# Patient Record
Sex: Female | Born: 1970 | Race: White | Hispanic: No | Marital: Married | Smoking: Never smoker
Health system: Southern US, Community
[De-identification: ages and names within clinical notes are randomized; demographics above are authoritative.]

## PROBLEM LIST (undated history)

## (undated) DIAGNOSIS — M069 Rheumatoid arthritis, unspecified: Secondary | ICD-10-CM

## (undated) DIAGNOSIS — G4700 Insomnia, unspecified: Secondary | ICD-10-CM

## (undated) DIAGNOSIS — E039 Hypothyroidism, unspecified: Secondary | ICD-10-CM

## (undated) DIAGNOSIS — F32A Depression, unspecified: Secondary | ICD-10-CM

## (undated) DIAGNOSIS — K219 Gastro-esophageal reflux disease without esophagitis: Secondary | ICD-10-CM

## (undated) DIAGNOSIS — F329 Major depressive disorder, single episode, unspecified: Secondary | ICD-10-CM

## (undated) DIAGNOSIS — F419 Anxiety disorder, unspecified: Secondary | ICD-10-CM

## (undated) DIAGNOSIS — M1990 Unspecified osteoarthritis, unspecified site: Secondary | ICD-10-CM

## (undated) DIAGNOSIS — I10 Essential (primary) hypertension: Secondary | ICD-10-CM

## (undated) HISTORY — PX: TUBAL LIGATION: SHX77

## (undated) HISTORY — PX: CHOLECYSTECTOMY: SHX55

---

## 2004-07-02 ENCOUNTER — Emergency Department: Payer: Self-pay | Admitting: Emergency Medicine

## 2004-12-09 ENCOUNTER — Emergency Department: Payer: Self-pay | Admitting: Emergency Medicine

## 2004-12-13 ENCOUNTER — Emergency Department: Payer: Self-pay | Admitting: Emergency Medicine

## 2005-04-26 ENCOUNTER — Emergency Department: Payer: Self-pay | Admitting: Emergency Medicine

## 2005-06-16 ENCOUNTER — Emergency Department: Payer: Self-pay | Admitting: General Practice

## 2005-06-17 ENCOUNTER — Ambulatory Visit: Payer: Self-pay | Admitting: General Practice

## 2006-08-15 ENCOUNTER — Emergency Department: Payer: Self-pay | Admitting: Emergency Medicine

## 2007-01-30 ENCOUNTER — Emergency Department: Payer: Self-pay | Admitting: Emergency Medicine

## 2007-04-09 ENCOUNTER — Other Ambulatory Visit: Payer: Self-pay

## 2007-04-09 ENCOUNTER — Emergency Department: Payer: Self-pay | Admitting: Emergency Medicine

## 2007-12-24 ENCOUNTER — Emergency Department: Payer: Self-pay | Admitting: Emergency Medicine

## 2008-02-18 ENCOUNTER — Emergency Department: Payer: Self-pay | Admitting: Emergency Medicine

## 2008-04-13 ENCOUNTER — Emergency Department: Payer: Self-pay | Admitting: Internal Medicine

## 2008-04-23 ENCOUNTER — Ambulatory Visit: Payer: Self-pay | Admitting: Family Medicine

## 2008-05-11 ENCOUNTER — Emergency Department: Payer: Self-pay | Admitting: Emergency Medicine

## 2008-05-22 ENCOUNTER — Ambulatory Visit: Payer: Self-pay | Admitting: Family Medicine

## 2008-06-05 ENCOUNTER — Ambulatory Visit: Payer: Self-pay | Admitting: Urology

## 2009-06-22 ENCOUNTER — Ambulatory Visit: Payer: Self-pay | Admitting: Otolaryngology

## 2009-06-25 ENCOUNTER — Emergency Department: Payer: Self-pay | Admitting: Emergency Medicine

## 2010-02-24 ENCOUNTER — Emergency Department: Payer: Self-pay | Admitting: Emergency Medicine

## 2010-04-27 ENCOUNTER — Emergency Department: Payer: Self-pay | Admitting: Internal Medicine

## 2010-11-07 ENCOUNTER — Emergency Department: Payer: Self-pay | Admitting: Emergency Medicine

## 2011-09-02 ENCOUNTER — Emergency Department: Payer: Self-pay | Admitting: *Deleted

## 2011-09-02 LAB — COMPREHENSIVE METABOLIC PANEL
Alkaline Phosphatase: 64 U/L (ref 50–136)
Anion Gap: 7 (ref 7–16)
BUN: 13 mg/dL (ref 7–18)
Bilirubin,Total: 0.4 mg/dL (ref 0.2–1.0)
Calcium, Total: 8 mg/dL — ABNORMAL LOW (ref 8.5–10.1)
Chloride: 105 mmol/L (ref 98–107)
Co2: 27 mmol/L (ref 21–32)
Creatinine: 0.67 mg/dL (ref 0.60–1.30)
Glucose: 65 mg/dL (ref 65–99)
Osmolality: 276 (ref 275–301)
SGPT (ALT): 15 U/L
Sodium: 139 mmol/L (ref 136–145)
Total Protein: 7.6 g/dL (ref 6.4–8.2)

## 2011-09-02 LAB — URINALYSIS, COMPLETE
Leukocyte Esterase: NEGATIVE
Ph: 5 (ref 4.5–8.0)
Specific Gravity: 1.019 (ref 1.003–1.030)
Squamous Epithelial: 5
WBC UR: 1 /HPF (ref 0–5)

## 2011-09-02 LAB — CBC
HCT: 31 % — ABNORMAL LOW (ref 35.0–47.0)
MCH: 24.1 pg — ABNORMAL LOW (ref 26.0–34.0)
MCV: 77 fL — ABNORMAL LOW (ref 80–100)
RDW: 18.1 % — ABNORMAL HIGH (ref 11.5–14.5)

## 2011-09-02 LAB — PREGNANCY, URINE: Pregnancy Test, Urine: NEGATIVE m[IU]/mL

## 2011-09-02 LAB — WET PREP, GENITAL

## 2011-12-19 ENCOUNTER — Emergency Department: Payer: Self-pay | Admitting: *Deleted

## 2011-12-19 LAB — CBC
HCT: 31.3 % — ABNORMAL LOW (ref 35.0–47.0)
HGB: 10.1 g/dL — ABNORMAL LOW (ref 12.0–16.0)
MCH: 23.7 pg — ABNORMAL LOW (ref 26.0–34.0)
MCHC: 32.3 g/dL (ref 32.0–36.0)
MCV: 73 fL — ABNORMAL LOW (ref 80–100)
Platelet: 274 10*3/uL (ref 150–440)
RBC: 4.28 10*6/uL (ref 3.80–5.20)
RDW: 19.1 % — ABNORMAL HIGH (ref 11.5–14.5)
WBC: 6.2 10*3/uL (ref 3.6–11.0)

## 2011-12-19 LAB — BASIC METABOLIC PANEL
Anion Gap: 6 — ABNORMAL LOW (ref 7–16)
BUN: 12 mg/dL (ref 7–18)
Calcium, Total: 8.6 mg/dL (ref 8.5–10.1)
Chloride: 104 mmol/L (ref 98–107)
Co2: 28 mmol/L (ref 21–32)
Creatinine: 0.72 mg/dL (ref 0.60–1.30)
EGFR (African American): >60
Glucose: 115 mg/dL — ABNORMAL HIGH (ref 65–99)
Osmolality: 276 (ref 275–301)

## 2011-12-19 LAB — URINALYSIS, COMPLETE
Bacteria: NONE SEEN
Bilirubin,UR: NEGATIVE
Glucose,UR: NEGATIVE mg/dL (ref 0–75)
Ketone: NEGATIVE
Ph: 5 (ref 4.5–8.0)
Protein: 30
Specific Gravity: 1.019 (ref 1.003–1.030)
Squamous Epithelial: 3

## 2012-07-31 ENCOUNTER — Emergency Department: Payer: Self-pay | Admitting: Emergency Medicine

## 2012-07-31 LAB — URINALYSIS, COMPLETE
Bacteria: NONE SEEN
Bilirubin,UR: NEGATIVE
Leukocyte Esterase: NEGATIVE
Protein: NEGATIVE
RBC,UR: 2 /HPF (ref 0–5)
Specific Gravity: 1.017 (ref 1.003–1.030)
Squamous Epithelial: 22
WBC UR: 1 /HPF (ref 0–5)

## 2012-07-31 LAB — CBC WITH DIFFERENTIAL/PLATELET
Basophil %: 1 %
HCT: 30.7 % — ABNORMAL LOW (ref 35.0–47.0)
HGB: 9.6 g/dL — ABNORMAL LOW (ref 12.0–16.0)
Lymphocyte #: 1.3 10*3/uL (ref 1.0–3.6)
MCH: 21.9 pg — ABNORMAL LOW (ref 26.0–34.0)
RBC: 4.36 10*6/uL (ref 3.80–5.20)

## 2012-07-31 LAB — COMPREHENSIVE METABOLIC PANEL
Albumin: 3.3 g/dL — ABNORMAL LOW (ref 3.4–5.0)
Alkaline Phosphatase: 79 U/L (ref 50–136)
Anion Gap: 6 — ABNORMAL LOW (ref 7–16)
BUN: 13 mg/dL (ref 7–18)
Chloride: 104 mmol/L (ref 98–107)
Co2: 27 mmol/L (ref 21–32)
EGFR (African American): >60
EGFR (Non-African Amer.): >60
Glucose: 84 mg/dL (ref 65–99)
Osmolality: 273 (ref 275–301)
Potassium: 4.1 mmol/L (ref 3.5–5.1)
Sodium: 137 mmol/L (ref 136–145)
Total Protein: 7.6 g/dL (ref 6.4–8.2)

## 2012-07-31 LAB — WET PREP, GENITAL

## 2012-07-31 LAB — LIPASE, BLOOD: Lipase: 109 U/L (ref 73–393)

## 2012-08-02 ENCOUNTER — Emergency Department: Payer: Self-pay | Admitting: Emergency Medicine

## 2012-08-02 LAB — URINALYSIS, COMPLETE
Bacteria: NONE SEEN
Bilirubin,UR: NEGATIVE
Blood: NEGATIVE
Ketone: NEGATIVE
Nitrite: NEGATIVE
Ph: 6 (ref 4.5–8.0)
Specific Gravity: 1.015 (ref 1.003–1.030)
Squamous Epithelial: 4
WBC UR: 1 /HPF (ref 0–5)

## 2012-08-02 LAB — CBC
HCT: 33 % — ABNORMAL LOW (ref 35.0–47.0)
HGB: 10.2 g/dL — ABNORMAL LOW (ref 12.0–16.0)
MCH: 21.9 pg — ABNORMAL LOW (ref 26.0–34.0)
MCV: 71 fL — ABNORMAL LOW (ref 80–100)
RBC: 4.67 10*6/uL (ref 3.80–5.20)
WBC: 5.2 10*3/uL (ref 3.6–11.0)

## 2012-08-02 LAB — COMPREHENSIVE METABOLIC PANEL
Albumin: 3.7 g/dL (ref 3.4–5.0)
Alkaline Phosphatase: 88 U/L (ref 50–136)
Anion Gap: 8 (ref 7–16)
BUN: 15 mg/dL (ref 7–18)
Bilirubin,Total: 0.4 mg/dL (ref 0.2–1.0)
Calcium, Total: 8.7 mg/dL (ref 8.5–10.1)
Chloride: 100 mmol/L (ref 98–107)
Creatinine: 0.79 mg/dL (ref 0.60–1.30)
EGFR (African American): >60
Potassium: 3.7 mmol/L (ref 3.5–5.1)
SGOT(AST): 46 U/L — ABNORMAL HIGH (ref 15–37)
SGPT (ALT): 39 U/L (ref 12–78)
Sodium: 134 mmol/L — ABNORMAL LOW (ref 136–145)
Total Protein: 8.3 g/dL — ABNORMAL HIGH (ref 6.4–8.2)

## 2012-08-02 LAB — LIPASE, BLOOD: Lipase: 182 U/L (ref 73–393)

## 2012-09-08 ENCOUNTER — Emergency Department: Payer: Self-pay | Admitting: Emergency Medicine

## 2013-06-23 ENCOUNTER — Emergency Department: Payer: Self-pay | Admitting: Emergency Medicine

## 2013-06-24 LAB — CBC
HCT: 35 % (ref 35.0–47.0)
HGB: 11.3 g/dL — ABNORMAL LOW (ref 12.0–16.0)
MCH: 27.2 pg (ref 26.0–34.0)
MCHC: 32.4 g/dL (ref 32.0–36.0)
MCV: 84 fL (ref 80–100)
PLATELETS: 228 10*3/uL (ref 150–440)
RBC: 4.17 10*6/uL (ref 3.80–5.20)
RDW: 17.7 % — AB (ref 11.5–14.5)
WBC: 4.8 10*3/uL (ref 3.6–11.0)

## 2013-06-24 LAB — BASIC METABOLIC PANEL
Anion Gap: 9 (ref 7–16)
BUN: 8 mg/dL (ref 7–18)
CALCIUM: 8.1 mg/dL — AB (ref 8.5–10.1)
CHLORIDE: 100 mmol/L (ref 98–107)
Co2: 24 mmol/L (ref 21–32)
Creatinine: 0.68 mg/dL (ref 0.60–1.30)
EGFR (Non-African Amer.): >60
GLUCOSE: 82 mg/dL (ref 65–99)
Osmolality: 264 (ref 275–301)
POTASSIUM: 3.7 mmol/L (ref 3.5–5.1)
SODIUM: 133 mmol/L — AB (ref 136–145)

## 2013-06-24 LAB — TROPONIN I: Troponin-I: <0.02 ng/mL

## 2013-08-25 ENCOUNTER — Inpatient Hospital Stay: Payer: Self-pay | Admitting: Surgery

## 2013-08-25 LAB — CBC WITH DIFFERENTIAL/PLATELET
Basophil #: 0 10*3/uL (ref 0.0–0.1)
Basophil %: 0.2 %
Eosinophil #: 0 10*3/uL (ref 0.0–0.7)
Eosinophil %: 0.5 %
HCT: 35 % (ref 35.0–47.0)
HGB: 11.3 g/dL — ABNORMAL LOW (ref 12.0–16.0)
LYMPHS ABS: 1 10*3/uL (ref 1.0–3.6)
LYMPHS PCT: 13.3 %
MCH: 26.4 pg (ref 26.0–34.0)
MCHC: 32.2 g/dL (ref 32.0–36.0)
MCV: 82 fL (ref 80–100)
Monocyte #: 0.6 x10 3/mm (ref 0.2–0.9)
Monocyte %: 7.6 %
NEUTROS ABS: 6.2 10*3/uL (ref 1.4–6.5)
Neutrophil %: 78.4 %
PLATELETS: 212 10*3/uL (ref 150–440)
RBC: 4.26 10*6/uL (ref 3.80–5.20)
RDW: 17.1 % — AB (ref 11.5–14.5)
WBC: 7.9 10*3/uL (ref 3.6–11.0)

## 2013-08-25 LAB — COMPREHENSIVE METABOLIC PANEL
ALBUMIN: 3.8 g/dL (ref 3.4–5.0)
ALK PHOS: 56 U/L
ANION GAP: 8 (ref 7–16)
AST: 98 U/L — AB (ref 15–37)
BILIRUBIN TOTAL: 1.1 mg/dL — AB (ref 0.2–1.0)
BUN: 11 mg/dL (ref 7–18)
CO2: 22 mmol/L (ref 21–32)
Calcium, Total: 8.3 mg/dL — ABNORMAL LOW (ref 8.5–10.1)
Chloride: 101 mmol/L (ref 98–107)
Creatinine: 0.8 mg/dL (ref 0.60–1.30)
EGFR (African American): >60
EGFR (Non-African Amer.): >60
Glucose: 128 mg/dL — ABNORMAL HIGH (ref 65–99)
Osmolality: 264 (ref 275–301)
POTASSIUM: 4.1 mmol/L (ref 3.5–5.1)
SGPT (ALT): 100 U/L — ABNORMAL HIGH (ref 12–78)
Sodium: 131 mmol/L — ABNORMAL LOW (ref 136–145)
Total Protein: 8.8 g/dL — ABNORMAL HIGH (ref 6.4–8.2)

## 2013-08-25 LAB — URINALYSIS, COMPLETE
Bilirubin,UR: NEGATIVE
Glucose,UR: NEGATIVE mg/dL (ref 0–75)
LEUKOCYTE ESTERASE: NEGATIVE
NITRITE: NEGATIVE
Ph: 5 (ref 4.5–8.0)
Protein: 100
RBC,UR: 86 /HPF (ref 0–5)
Specific Gravity: 1.025 (ref 1.003–1.030)
WBC UR: 5 /HPF (ref 0–5)

## 2013-08-25 LAB — PREGNANCY, URINE: Pregnancy Test, Urine: NEGATIVE m[IU]/mL

## 2013-08-25 LAB — LIPASE, BLOOD: LIPASE: 212 U/L (ref 73–393)

## 2013-08-25 LAB — TROPONIN I

## 2013-08-26 LAB — COMPREHENSIVE METABOLIC PANEL
Albumin: 3 g/dL — ABNORMAL LOW (ref 3.4–5.0)
Alkaline Phosphatase: 60 U/L
Anion Gap: 2 — ABNORMAL LOW (ref 7–16)
BILIRUBIN TOTAL: 0.6 mg/dL (ref 0.2–1.0)
BUN: 13 mg/dL (ref 7–18)
Calcium, Total: 8.1 mg/dL — ABNORMAL LOW (ref 8.5–10.1)
Chloride: 107 mmol/L (ref 98–107)
Co2: 29 mmol/L (ref 21–32)
Creatinine: 0.9 mg/dL (ref 0.60–1.30)
EGFR (African American): >60
EGFR (Non-African Amer.): >60
GLUCOSE: 107 mg/dL — AB (ref 65–99)
Osmolality: 276 (ref 275–301)
POTASSIUM: 4.3 mmol/L (ref 3.5–5.1)
SGOT(AST): 56 U/L — ABNORMAL HIGH (ref 15–37)
SGPT (ALT): 75 U/L (ref 12–78)
SODIUM: 138 mmol/L (ref 136–145)
Total Protein: 7.1 g/dL (ref 6.4–8.2)

## 2013-08-26 LAB — CBC WITH DIFFERENTIAL/PLATELET
BASOS ABS: 0 10*3/uL (ref 0.0–0.1)
Basophil %: 0.3 %
EOS ABS: 0.1 10*3/uL (ref 0.0–0.7)
Eosinophil %: 2.4 %
HCT: 30.7 % — ABNORMAL LOW (ref 35.0–47.0)
HGB: 9.8 g/dL — ABNORMAL LOW (ref 12.0–16.0)
LYMPHS PCT: 27.2 %
Lymphocyte #: 1.3 10*3/uL (ref 1.0–3.6)
MCH: 26.3 pg (ref 26.0–34.0)
MCHC: 32.1 g/dL (ref 32.0–36.0)
MCV: 82 fL (ref 80–100)
MONO ABS: 0.7 x10 3/mm (ref 0.2–0.9)
MONOS PCT: 14.6 %
NEUTROS PCT: 55.5 %
Neutrophil #: 2.7 10*3/uL (ref 1.4–6.5)
Platelet: 169 10*3/uL (ref 150–440)
RBC: 3.74 10*6/uL — AB (ref 3.80–5.20)
RDW: 16.9 % — ABNORMAL HIGH (ref 11.5–14.5)
WBC: 4.8 10*3/uL (ref 3.6–11.0)

## 2013-08-27 LAB — PATHOLOGY REPORT

## 2013-10-09 ENCOUNTER — Telehealth: Payer: Self-pay

## 2013-10-15 NOTE — Telephone Encounter (Signed)
Opened in error, wrong pt

## 2014-08-02 NOTE — H&P (Signed)
Subjective/Chief Complaint RUQ pain   History of Present Illness 12 hours pain, nausea, no emesis, no f/c, no prior episode no jaundice no ach stools worsening   Past History RA, HTN, GERD PSH tubal lig, thyroidectomy   Past Medical Health Hypertension, Alcohol Consumption/Dependency   Past Med/Surgical Hx:  depression:   insomnia:   MRSA-2009:   PSORISIS:   GERD - Esophageal Reflux:   Hypothyroidism:   Arthritis:   Tubal Ligation:   ALLERGIES:  Sulfa drugs: Other  HOME MEDICATIONS: Medication Instructions Status  Ultram 50 mg oral tablet 2 tab(s) orally every 8 hours, As Needed - for Pain Active  omeprazole 20 mg oral delayed release capsule 1 cap(s) PO 2 times a day  Active  levothyroxine 112 mcg (0.112 mg) oral tablet 1 tab(s)  once a day AM Active  Enbrel  subcutaneous  Active  Cipro 750 mg oral tablet 1 tab(s) orally every 12 hours Active  doxycycline hyclate hyclate 100 mg oral tablet 1 tab(s) orally 2 times a day Active   Family and Social History:  Family History Non-Contributory   Social History negative tobacco, positive ETOH, disabled   Place of Living Home   Review of Systems:  Fever/Chills No   Cough No   Abdominal Pain Yes   Diarrhea No   Constipation No   Nausea/Vomiting Yes   SOB/DOE No   Chest Pain No   Dysuria No   Tolerating Diet Yes  Nauseated   Physical Exam:  GEN no acute distress, thin   HEENT pink conjunctivae, no icterus, mult piercings   NECK supple   RESP normal resp effort  clear BS   CARD regular rate   ABD positive tenderness  RUQ, pos Murphy's sign   LYMPH negative axillae   EXTR negative edema   SKIN normal to palpation, piercings, tattoos   PSYCH alert, A+O to time, place, person, good insight   Lab Results: Hepatic:  17-May-15 16:36   Bilirubin, Total  1.1  Alkaline Phosphatase 56 (45-117 NOTE: New Reference Range 03/01/13)  SGPT (ALT)  100  SGOT (AST)  98  Total Protein, Serum  8.8   Albumin, Serum 3.8  Routine Chem:  17-May-15 16:36   Glucose, Serum  128  BUN 11  Creatinine (comp) 0.80  Sodium, Serum  131  Potassium, Serum 4.1  Chloride, Serum 101  CO2, Serum 22  Calcium (Total), Serum  8.3  Osmolality (calc) 264  eGFR (African American) >60  eGFR (Non-African American) >60 (eGFR values <17m/min/1.73 m2 may be an indication of chronic kidney disease (CKD). Calculated eGFR is useful in patients with stable renal function. The eGFR calculation will not be reliable in acutely ill patients when serum creatinine is changing rapidly. It is not useful in  patients on dialysis. The eGFR calculation may not be applicable to patients at the low and high extremes of body sizes, pregnant women, and vegetarians.)  Anion Gap 8  Lipase 212 (Result(s) reported on 25 Aug 2013 at 05:10PM.)  Cardiac:  17-May-15 16:36   Troponin I < 0.02 (0.00-0.05 0.05 ng/mL or less: NEGATIVE  Repeat testing in 3-6 hrs  if clinically indicated. >0.05 ng/mL: POTENTIAL  MYOCARDIAL INJURY. Repeat  testing in 3-6 hrs if  clinically indicated. NOTE: An increase or decrease  of 30% or more on serial  testing suggests a  clinically important change)  Routine UA:  17-May-15 16:36   Color (UA) Amber  Clarity (UA) Hazy  Glucose (UA) Negative  Bilirubin (UA)  Negative  Ketones (UA) Trace  Specific Gravity (UA) 1.025  Blood (UA) 3+  pH (UA) 5.0  Protein (UA) 100 mg/dL  Nitrite (UA) Negative  Leukocyte Esterase (UA) Negative (Result(s) reported on 25 Aug 2013 at 05:10PM.)  RBC (UA) 86 /HPF  WBC (UA) 5 /HPF  Bacteria (UA) TRACE  Epithelial Cells (UA) 7 /HPF  Mucous (UA) PRESENT (Result(s) reported on 25 Aug 2013 at 05:10PM.)  Routine Sero:  17-May-15 16:36   Pregnancy Test, Urine NEGATIVE (The results of the qualitative urine HCG (Pregnancy Test) should be evaluated in light of other clinical information.  There are limitations to the test which, in certain clinical situations,  may result in a false positive or negative result. Thehigh dose hook effect can occur in urine samples with extremely high HCG concentrations.  This effect can produce a negative result in certain situations. It is suggested that results of the qualitative HCG be confirmed by an alternate methodology, such as the quantitative serum beta HCG test.)  Routine Hem:  17-May-15 16:36   WBC (CBC) 7.9  RBC (CBC) 4.26  Hemoglobin (CBC)  11.3  Hematocrit (CBC) 35.0  Platelet Count (CBC) 212  MCV 82  MCH 26.4  MCHC 32.2  RDW  17.1  Neutrophil % 78.4  Lymphocyte % 13.3  Monocyte % 7.6  Eosinophil % 0.5  Basophil % 0.2  Neutrophil # 6.2  Lymphocyte # 1.0  Monocyte # 0.6  Eosinophil # 0.0  Basophil # 0.0 (Result(s) reported on 25 Aug 2013 at 05:05PM.)   Radiology Results: [place]:    17-May-15 18:43, US Abdomen Limited Survey  US Abdomen Limited Survey  REASON FOR EXAM:    RUQ pain  COMMENTS:   Body Site: Gallbladder, Liver, Common Bile Duct    PROCEDURE: [place]  - US ABDOMEN LIMITED SURVEY  - Aug 25 2013  6:43PM     CLINICAL DATA:  Right upper quadrant pain    EXAM:  US ABDOMEN LIMITED - RIGHT UPPER QUADRANT    COMPARISON:  CT ABD-PELV W/ CM dated 08/02/2012    FINDINGS:  Gallbladder:  Circumferential gallbladder wall thickening measuring up to 7 mm.  Small amount of pericholecystic fluid. Sonographer reports positive  sonographic Murphy's sign. No cholelithiasis.    Common bile duct:    Diameter: 7 mm    Liver:    No focal lesion identified.  Mildly increased in echogenicity.     IMPRESSION:  Gallbladder wall thickening with a small amount of pericholecystic  fluid. Findings may represent acuteacalculous cholecystitis in the  appropriate clinical setting.    The common bile duct is dilated measuring up to 7 mm. Causative  etiology is not identified on this examination.    Findings can be correlated with MRCP as clinically indicated.    Increased hepatic parenchymal  echogenicity as can be seen with  hepatic steatosis.      Electronically Signed    By: Lovey Newcomer M.D.    On: 08/25/2013 18:46     Verified By: Ilsa Iha, M.D.,    Assessment/Admission Diagnosis acute cholecystitis, suspect sludge with inc LFT rec admit, IV abx, repeat LFT then proceed with lap chole pt understands Dr Leanora Cover will see in am   Electronic Signatures: Florene Glen (MD)  (Signed 17-May-15 19:50)  Authored: CHIEF COMPLAINT and HISTORY, PAST MEDICAL/SURGIAL HISTORY, ALLERGIES, HOME MEDICATIONS, FAMILY AND SOCIAL HISTORY, REVIEW OF SYSTEMS, PHYSICAL EXAM, LABS, Radiology, ASSESSMENT AND PLAN   Last Updated: 17-May-15 19:50 by Phoebe Perch  E (MD)

## 2014-08-02 NOTE — Op Note (Signed)
PATIENT NAME:  Marcia Pugh, Marcia Pugh MR#:  [id number] DATE OF BIRTH:  1970-08-14  DATE OF PROCEDURE:  08/26/2013  OPERATION PERFORMED: Laparoscopic cholecystectomy with intraoperative cholangiogram.   PREOPERATIVE DIAGNOSIS: Symptomatic acalculous cholecystitis.   POSTOPERATIVE DIAGNOSIS: Symptomatic acalculous cholecystitis.   SURGEON: Claude Manges, MD   ANESTHESIA: General.   PROCEDURE IN DETAIL: The patient was placed supine on the operating room table and prepped and draped in the usual sterile fashion. A 15 mmHg CO2 pneumoperitoneum was created via a Veress needle in the infraumbilical position, and this was replaced with a 5 mm trocar and a 30 degree angled laparoscope. Remaining trocars were placed under direct visualization. The fundus of the gallbladder was retracted superiorly and ventrally, and the very edematous gallbladder wall had chronic adhesions from the omentum to its visceral surface and these were taken down bluntly with the aid of the electrocautery. The infundibulum was retracted laterally, opening up the triangle of Calot, and dissection within the triangle of Calot revealed a cystic artery, which was doubly clipped and divided, and a cystic duct and a clip was placed at the cystic duct-gallbladder junction and a small ductotomy was performed, and a Reddick catheter was used with fluoroscopy and half-strength Hypaque for an intraoperative cholangiogram. Initially the balloon was occluding the distal common bile duct, and only the intrahepatic radicles and common hepatic duct were visualized. I was able to draw the balloon back and visualize the common bile duct and the sphincter of Oddi and contrast flowing directly into the duodenum. There were no filling defects. Therefore, the Reddick catheter was removed and the cystic duct stump was closed with 2 firings of the Hem-o-Lok clip device parallel to one another. An additional clip was placed on a posterior cystic artery, and the  gallbladder was removed from the liver bed with the electrocautery, placed in an Endo Catch bag, and extracted from the abdomen via the epigastric port. This port site fascia was closed with the laparoscopic puncture closure device and a 0 Vicryl suture, and the right upper quadrant was irrigated with copious amounts of warm normal saline. This was suctioned clear. Hemostasis was excellent. The clips were secure and there was no evidence of bile staining. The gallbladder was opened extracorporeally and indeed contained no stones and no sludge, but the wall was indeed edematous and chronically inflamed. All 4 skin sites were closed with subcuticular 5-0 Monocryl and suture strips. The patient tolerated the procedure well. There were no complications.    ____________________________ Claude Manges, MD wfm:jcm D: 08/26/2013 14:29:10 ET T: 08/26/2013 15:18:49 ET JOB#: [id number]  cc: Claude Manges, MD, <Dictator> Claude Manges MD ELECTRONICALLY SIGNED 08/28/2013 8:00

## 2014-08-02 NOTE — Discharge Summary (Signed)
PATIENT NAME:  Marcia Pugh, Marcia Pugh MR#:  [id number] DATE OF BIRTH:  [birth year]  DATE OF ADMISSION:  08/25/2013 DATE OF DISCHARGE:  08/27/2013  PRINCIPLE DIAGNOSIS: Acute acalculous cholecystitis.   OTHER DIAGNOSES: 1.  Rheumatoid arthritis. 2.  Hypertension. 3.  Gastroesophageal reflux disease. 4.  Status post thyroidectomy.   PRINCIPAL PROCEDURE PERFORMED DURING THIS ADMISSION: Laparoscopic cholecystectomy with intraoperative cholangiogram.   HOSPITAL COURSE: The patient was admitted to the hospital, given IV antibiotics, taken to the operating room where she underwent the above-mentioned procedure for the above-mentioned diagnosis, and on postoperative day 1 she was doing well and was discharged home with a prescription for Percocet for pain. She was asked to make an appointment to see me in 2 weeks and to call the office in the interim for jaundice, recurrent right upper quadrant pain, fever, or other surgical difficulties.   ____________________________ Claude Manges, MD wfm:sb D: 08/27/2013 08:06:09 ET T: 08/27/2013 08:32:23 ET JOB#: [id number]  cc: Claude Manges, MD, <Dictator> Claude Manges MD ELECTRONICALLY SIGNED 08/28/2013 8:00

## 2014-08-02 NOTE — H&P (Signed)
PATIENT NAME:  Marcia Pugh, SKELLEY MR#:  [id number] DATE OF BIRTH:  [birth year]  DATE OF ADMISSION:  08/25/2013  CHIEF COMPLAINT: Right upper quadrant pain.   HISTORY OF PRESENT ILLNESS: This is a patient with approximately 12 hours of abdominal pain that started in the epigastrium and right upper quadrant. She did not relate it to any fatty foods and was perfectly fine yesterday. It woke her up this morning. She has had some nausea, but no emesis, has never had an episode like this before. Denies fevers or chills. Denies jaundice or acholic stools and states that her pain is worsening from this morning.   PAST MEDICAL HISTORY: Rheumatoid arthritis, hypertension, reflux disease.   PAST SURGICAL HISTORY: Thyroidectomy, tubal ligation.   FAMILY HISTORY: Noncontributory.   SOCIAL HISTORY: The patient does not smoke but she drinks approximately 8 beers per day. Does not use IV drugs and is disabled.   FAMILY HISTORY: Noncontributory.   REVIEW OF SYSTEMS: A 10-system review is performed and negative with the exception of that mentioned in the history of present illness.   MEDICATIONS: Multiple, see reconciliation chart.   ALLERGIES: SULFA.   PHYSICAL EXAMINATION: GENERAL: Healthy, comfortable-appearing, thin female patient with a BMI of 26.  HEENT: No scleral icterus.  NECK: No palpable neck nodes.  CHEST: Clear to auscultation.  CARDIAC: Regular rate and rhythm.  ABDOMEN: Soft. There is tenderness in the right upper quadrant with a positive Murphy's sign. Tubal ligation scar is noted. Nondistended, nontympanitic.  EXTREMITIES: Without edema.  NEUROLOGIC: Grossly intact.  INTEGUMENT: No jaundice.   LABORATORY, DIAGNOSTIC AND RADIOLOGICAL DATA:  An ultrasound shows probable acalculous cholecystitis although in personal review I suspect that there is sludge present as well. Urine pregnancy test is negative. Lipase of 212, AST and ALT of 98 and 100 with a bilirubin 1.1, sodium of 131 glucose of  128. White blood cell count 7.9, hemoglobin and hematocrit of 11 and 35 and a platelet count 212.   ASSESSMENT AND PLAN: This is a patient with probable sludge causing cholecystitis with elevated liver function tests.  I have recommended admission to the hospital, starting IV antibiotics, hydrating and controlling her nausea and pain; re-evaluating with additional liver function tests in the morning. I suspect she will likely need laparoscopic cholecystectomy for control of her symptoms. Cholangiograms would be indicated as well. I will discuss this with Dr. Anda Kraft, who will be covering tomorrow, and I will make her n.p.o. after midnight. This has been discussed with her and she was in full agreement.   ____________________________ Adah Salvage. Excell Seltzer, MD rec:cs D: 08/25/2013 19:58:57 ET T: 08/25/2013 20:18:13 ET JOB#: [id number]  cc: Adah Salvage. Excell Seltzer, MD, <Dictator> Lattie Haw MD ELECTRONICALLY SIGNED 08/25/2013 21:29

## 2014-08-05 ENCOUNTER — Emergency Department
Admit: 2014-08-05 | Disposition: A | Discharge status: Home / Self Care | Payer: Self-pay | Admitting: Emergency Medicine

## 2014-08-08 ENCOUNTER — Emergency Department
Admit: 2014-08-08 | Disposition: A | Discharge status: Home / Self Care | Payer: Self-pay | Admitting: Emergency Medicine

## 2014-08-10 ENCOUNTER — Inpatient Hospital Stay
Admission: EM | Admit: 2014-08-10 | Discharge: 2014-08-16 | DRG: 603 | Disposition: A | Discharge status: Home / Self Care | Payer: Medicare Other | Attending: Internal Medicine | Admitting: Internal Medicine

## 2014-08-10 ENCOUNTER — Emergency Department: Payer: Medicare Other

## 2014-08-10 ENCOUNTER — Encounter: Payer: Self-pay | Admitting: Emergency Medicine

## 2014-08-10 DIAGNOSIS — Z79899 Other long term (current) drug therapy: Secondary | ICD-10-CM

## 2014-08-10 DIAGNOSIS — M1990 Unspecified osteoarthritis, unspecified site: Secondary | ICD-10-CM | POA: Diagnosis not present

## 2014-08-10 DIAGNOSIS — Z452 Encounter for adjustment and management of vascular access device: Secondary | ICD-10-CM

## 2014-08-10 DIAGNOSIS — M069 Rheumatoid arthritis, unspecified: Secondary | ICD-10-CM | POA: Diagnosis present

## 2014-08-10 DIAGNOSIS — E039 Hypothyroidism, unspecified: Secondary | ICD-10-CM | POA: Diagnosis not present

## 2014-08-10 DIAGNOSIS — F32A Depression, unspecified: Secondary | ICD-10-CM | POA: Diagnosis present

## 2014-08-10 DIAGNOSIS — I10 Essential (primary) hypertension: Secondary | ICD-10-CM | POA: Diagnosis present

## 2014-08-10 DIAGNOSIS — F329 Major depressive disorder, single episode, unspecified: Secondary | ICD-10-CM | POA: Diagnosis not present

## 2014-08-10 DIAGNOSIS — L03115 Cellulitis of right lower limb: Secondary | ICD-10-CM | POA: Diagnosis present

## 2014-08-10 DIAGNOSIS — R52 Pain, unspecified: Secondary | ICD-10-CM

## 2014-08-10 DIAGNOSIS — W540XXA Bitten by dog, initial encounter: Secondary | ICD-10-CM | POA: Diagnosis present

## 2014-08-10 DIAGNOSIS — F419 Anxiety disorder, unspecified: Secondary | ICD-10-CM | POA: Diagnosis not present

## 2014-08-10 DIAGNOSIS — Z79891 Long term (current) use of opiate analgesic: Secondary | ICD-10-CM

## 2014-08-10 DIAGNOSIS — G4700 Insomnia, unspecified: Secondary | ICD-10-CM | POA: Diagnosis not present

## 2014-08-10 DIAGNOSIS — D509 Iron deficiency anemia, unspecified: Secondary | ICD-10-CM | POA: Diagnosis not present

## 2014-08-10 DIAGNOSIS — Z882 Allergy status to sulfonamides status: Secondary | ICD-10-CM | POA: Diagnosis not present

## 2014-08-10 DIAGNOSIS — K219 Gastro-esophageal reflux disease without esophagitis: Secondary | ICD-10-CM | POA: Diagnosis present

## 2014-08-10 DIAGNOSIS — E876 Hypokalemia: Secondary | ICD-10-CM | POA: Diagnosis present

## 2014-08-10 DIAGNOSIS — D649 Anemia, unspecified: Secondary | ICD-10-CM | POA: Diagnosis present

## 2014-08-10 HISTORY — DX: Essential (primary) hypertension: I10

## 2014-08-10 HISTORY — DX: Unspecified osteoarthritis, unspecified site: M19.90

## 2014-08-10 HISTORY — DX: Hypothyroidism, unspecified: E03.9

## 2014-08-10 HISTORY — DX: Depression, unspecified: F32.A

## 2014-08-10 HISTORY — DX: Insomnia, unspecified: G47.00

## 2014-08-10 HISTORY — DX: Major depressive disorder, single episode, unspecified: F32.9

## 2014-08-10 HISTORY — DX: Anxiety disorder, unspecified: F41.9

## 2014-08-10 HISTORY — DX: Rheumatoid arthritis, unspecified: M06.9

## 2014-08-10 HISTORY — DX: Gastro-esophageal reflux disease without esophagitis: K21.9

## 2014-08-10 LAB — COMPREHENSIVE METABOLIC PANEL
ALBUMIN: 3.7 g/dL (ref 3.5–5.0)
ALT: 34 U/L (ref 14–54)
ANION GAP: 7 (ref 5–15)
AST: 40 U/L (ref 15–41)
Alkaline Phosphatase: 54 U/L (ref 38–126)
BUN: 16 mg/dL (ref 6–20)
CALCIUM: 8.3 mg/dL — AB (ref 8.9–10.3)
CO2: 25 mmol/L (ref 22–32)
CREATININE: 0.73 mg/dL (ref 0.44–1.00)
Chloride: 106 mmol/L (ref 101–111)
GFR calc Af Amer: >60 mL/min (ref >60)
GFR calc non Af Amer: >60 mL/min (ref >60)
Glucose, Bld: 94 mg/dL (ref 65–99)
Potassium: 3.4 mmol/L — ABNORMAL LOW (ref 3.5–5.1)
Sodium: 138 mmol/L (ref 135–145)
TOTAL PROTEIN: 7.8 g/dL (ref 6.5–8.1)
Total Bilirubin: 0.6 mg/dL (ref 0.3–1.2)

## 2014-08-10 LAB — CBC WITH DIFFERENTIAL/PLATELET
Basophils Absolute: 0 10*3/uL (ref 0–0.1)
Eosinophils Absolute: 0.2 10*3/uL (ref 0–0.7)
Eosinophils Relative: 4 %
HCT: 28 % — ABNORMAL LOW (ref 35.0–47.0)
Hemoglobin: 8.6 g/dL — ABNORMAL LOW (ref 12.0–16.0)
Lymphs Abs: 1.1 10*3/uL (ref 1.0–3.6)
MCH: 22.6 pg — ABNORMAL LOW (ref 26.0–34.0)
MCHC: 30.9 g/dL — AB (ref 32.0–36.0)
MCV: 73.1 fL — AB (ref 80.0–100.0)
Monocytes Absolute: 0.5 10*3/uL (ref 0.2–0.9)
NEUTROS ABS: 2.7 10*3/uL (ref 1.4–6.5)
Neutrophils Relative %: 59 %
Platelets: 285 10*3/uL (ref 150–440)
RBC: 3.83 MIL/uL (ref 3.80–5.20)
RDW: 17.6 % — AB (ref 11.5–14.5)
WBC: 4.5 10*3/uL (ref 3.6–11.0)

## 2014-08-10 MED ORDER — CLINDAMYCIN PHOSPHATE 600 MG/50ML IV SOLN
600.0000 mg | Freq: Once | INTRAVENOUS | Status: AC
  Administered 2014-08-10: 600 mg via INTRAVENOUS

## 2014-08-10 MED ORDER — KETOROLAC TROMETHAMINE 30 MG/ML IJ SOLN
30.0000 mg | Freq: Once | INTRAMUSCULAR | Status: AC
  Administered 2014-08-10: 30 mg via INTRAVENOUS

## 2014-08-10 MED ORDER — KETOROLAC TROMETHAMINE 30 MG/ML IJ SOLN
INTRAMUSCULAR | Status: AC
  Filled 2014-08-10: qty 1

## 2014-08-10 MED ORDER — MORPHINE SULFATE 4 MG/ML IJ SOLN
4.0000 mg | Freq: Once | INTRAMUSCULAR | Status: AC
  Administered 2014-08-10: 4 mg via INTRAVENOUS

## 2014-08-10 MED ORDER — CLINDAMYCIN PHOSPHATE 600 MG/50ML IV SOLN
INTRAVENOUS | Status: AC
  Filled 2014-08-10: qty 50

## 2014-08-10 MED ORDER — MORPHINE SULFATE 4 MG/ML IJ SOLN
INTRAMUSCULAR | Status: AC
Start: 2014-08-10 — End: 2014-08-10
  Filled 2014-08-10: qty 1

## 2014-08-10 NOTE — ED Provider Notes (Signed)
[facility] Emergency Department Provider Note    ____________________________________________    I have reviewed the triage vital signs and the nursing notes.   HISTORY  Chief Complaint Foot Pain   HPI Marcia Pugh is a 44 y.o. female who is here complaining of worsening right foot pain swelling redness and draining fluid. It started approximately one day ago by a dog last week saw the podiatrist 2 days ago states that the wound appeared to be healing well at that time cellulitis per the patient says her antibiotics is not working her pain is now approximately an 8 out of 10 Hz with any type of movement feels like it's burning and swollencurrently no other associated symptoms    Past Medical History  Diagnosis Date  . Arthritis   . Hypertension   . Anxiety   . Insomnia   . GERD (gastroesophageal reflux disease)   . Depression   . Hypothyroidism   . RA (rheumatoid arthritis)     On enbrel - follows with Dr Beverley Fiedler    Patient Active Problem List   Diagnosis Date Noted  . HTN (hypertension) 08/11/2014  . Anemia 08/11/2014  . GERD (gastroesophageal reflux disease) 08/11/2014  . Depression 08/11/2014  . Arthritis 08/11/2014  . Hypothyroidism 08/11/2014  . Cellulitis of foot, right 08/10/2014    Past Surgical History  Procedure Laterality Date  . Cholecystectomy    . Tubal ligation      Current Outpatient Rx  Name  Route  Sig  Dispense  Refill  . levothyroxine (SYNTHROID, LEVOTHROID) 112 MCG tablet   Oral   Take 112 mcg by mouth daily before breakfast.          . lisinopril (PRINIVIL,ZESTRIL) 10 MG tablet   Oral   Take 10 mg by mouth daily.          Marland Kitchen omeprazole (PRILOSEC) 20 MG capsule   Oral   Take 20 mg by mouth daily.          . traZODone (DESYREL) 100 MG tablet   Oral   Take 100 mg by mouth at bedtime.          Marland Kitchen venlafaxine (EFFEXOR) 50 MG tablet   Oral   Take 50 mg by mouth 2 (two) times daily with a meal.           . Etanercept 25 MG/0.5ML SOSY   Subcutaneous   Inject into the skin.         Marland Kitchen ondansetron (ZOFRAN) 4 MG tablet   Oral   Take 1 tablet (4 mg total) by mouth every 6 (six) hours as needed for nausea.   20 tablet   0   . oxyCODONE-acetaminophen (PERCOCET) 7.5-325 MG per tablet   Oral   Take 1 tablet by mouth every 4 (four) hours as needed for severe pain.   40 tablet   0   . piperacillin-tazobactam (ZOSYN) 3.375 GM/50ML IVPB   Intravenous   Inject 50 mLs (3.375 g total) into the vein every 8 (eight) hours.   50 mL   42   . Vancomycin (VANCOCIN) 750 MG/150ML SOLN   Intravenous   Inject 150 mLs (750 mg total) into the vein every 8 (eight) hours.   150 mL   42     Allergies Sulfa antibiotics  Family History  Problem Relation Age of Onset  . Lymphoma Mother   . Diabetes type II Other     Social History History  Substance  Use Topics  . Smoking status: Never Smoker   . Smokeless tobacco: Not on file  . Alcohol Use: No    Review of Systems  Constitutional: Negative for fever. Eyes: Negative for visual changes. ENT: Negative for sore throat. Cardiovascular: Negative for chest pain. Respiratory: Negative for shortness of breath. Gastrointestinal: Negative for abdominal pain, vomiting and diarrhea. Genitourinary: Negative for dysuria. Musculoskeletal: Negative for back pain. Skin: Negative for rash. Neurological: Negative for headaches, focal weakness or numbness. Psychiatric patient is well no signs of anxiety   10-point ROS otherwise negative. Except for noted in the history of present illness  ____________________________________________   PHYSICAL EXAM:  VITAL SIGNS: ED Triage Vitals  Enc Vitals Group     BP 08/10/14 1912 179/98 mmHg     Pulse Rate 08/10/14 1912 71     Resp 08/10/14 1912 20     Temp 08/10/14 1912 98.2 F (36.8 C)     Temp Source 08/10/14 1912 Oral     SpO2 08/10/14 1912 96 %     Weight 08/10/14 1912 175 lb (79.379  kg)     Height 08/10/14 1912 5\' 8"  (1.727 m)     Head Cir --      Peak Flow --      Pain Score 08/10/14 1913 7     Pain Loc --      Pain Edu? --      Excl. in GC? --      Constitutional: Alert and oriented. Well appearing and in no distress. Eyes: Conjunctivae are normal. PERRL. Normal extraocular movements. ENT   Head: Normocephalic and atraumatic.   Nose: No congestion/rhinnorhea.   Mouth/Throat: Mucous membranes are moist.   Neck: No stridor. Hematological/Lymphatic/Immunilogical: No cervical lymphadenopathy. Cardiovascular: Normal rate, regular rhythm. Normal and symmetric distal pulses are present in all extremities. No murmurs, rubs, or gallops. Respiratory: Normal respiratory effort without tachypnea nor retractions. Breath sounds are clear and equal bilaterally. No wheezes/rales/rhonchi. Gastrointestinal: Soft and nontender. No distention. No abdominal bruits. There is no CVA tenderness. Genitourinary:  Musculoskeletal: swelling of the right foot with multiple healing wounds and a serosanguineous drainage Neurologic:  Normal speech and language. No gross focal neurologic deficits are appreciated. Speech is normal. No gait instability. Skin:  Patient has cellulitis of the right foot spreading up past the right ankle Psychiatric: Mood and affect are normal. Speech and behavior are normal. Patient exhibits appropriate insight and judgment.  ____________________________________________    ____________________________________________    RADIOLOGY  X-rays negative for any signs of gas foreign body or fracture otherwise  ____________________________________________   PROCEDURES  Procedure(s) performed: None  Critical Care performed: No  ____________________________________________   INITIAL IMPRESSION / ASSESSMENT AND PLAN / ED COURSE  Pertinent labs & imaging results that were available during my care of the patient were reviewed by me and considered  in my medical decision making (see chart for details).  Impression cellulitis secondary to dog bite injury spelled outpatient antibiotics will consult for admission Dr. 10/10/14 who is on-call for [facility] was consulted and is as asked Orland Jarred to speak with the hospitalist service for admission into consult them during her stay in the hospital  ____________________________________________   FINAL CLINICAL IMPRESSION(S) / ED DIAGNOSES  Final diagnoses:  Cellulitis of right lower extremity    Abhi Moccia [place], PA-C 08/17/14 1502  10/17/14, MD 08/17/14 2254

## 2014-08-10 NOTE — ED Notes (Signed)
Pt presents to ER alert and in NAD. Pt reports she is on antibiotics for dog bite to right foot. Pt states it has had increased swelling and drainage.

## 2014-08-10 NOTE — ED Notes (Signed)
Presented to ed for evaluation of RLE bite on Tuesday, now draining yellow discharge, warm to touch and swollen.

## 2014-08-10 NOTE — ED Provider Notes (Signed)
Medical screening examination/treatment/procedure(s) were conducted as a shared visit with non-physician practitioner(s) and myself.  I personally evaluated the patient during the encounter.  In summary, the patient sustained multiple dog bites to her right foot approximately 5 days ago. She was started on antibiotics and followed up with Dr. Ether Griffins (podiatry) 2 days ago. Her wounds appear to be healing well at that time. Unfortunately, since that office visit, the swelling, redness, and pain have gotten significantly worse. Though she has not had fever or chills, there is cellulitis and swelling of the entire right foot that extends just proximal to the ankle. The multiple loosely approximated sutured wounds are now under a significant amount of stress and strain from the swelling. There appears to be serosanguineous fluid leaking from multiple wounds. The foot and distal lower leg are extremely tender to the touch with even minimal palpation.  The patient has failed outpatient antibiotics and needs inpatient treatment for cellulitis which has rapidly worsened over the last 2 days. Fortunately at this time, based on her lab results and her physical exam, I believe this represents cellulitis and not a more complicated and morbid case of necrotizing fasciitis, though she needs aggressive treatment to avoid such an outcome. She received clindamycin 600 mg IV while in the emergency department. Mr. Gladis Riffle will consult podiatry for admission.  I discussed this plan of care with the patient and she is in agreement. She remains hemodynamically stable.  Loleta Rose, MD 08/10/14 2239

## 2014-08-10 NOTE — Consult Note (Signed)
PATIENT NAME:  Marcia Pugh, SHERRILL MR#:  [id number] DATE OF BIRTH:  1971-03-19 ACCOUNT#:  [id number]  DATE OF CONSULTATION:  08/05/2014.  REFERRING PHYSICIAN:  Dr. Pershing Proud.  CONSULTING PHYSICIAN:  Maryagnes Amos, MD.  REASON FOR CONSULTATION:  I have been asked by Dr. Pershing Proud to evaluate this unfortunate woman for a right ankle injury sustained earlier this afternoon.    HISTORY OF PRESENT ILLNESS:  Apparently, she is a 44 year old female with an anxiety disorder who apparently was going to fetch her small dog from her neighbor's yard when she was attacked by the neighbor's pit bull.  The pit bull initially bit her left flip-flop, pulling it off her foot and causing her to trip and fall.  He then attacked her right ankle causing several deep gouges/lacerations over the lateral aspect of the ankle.  The patient presented to the Emergency Room for further evaluation and treatment.  She denies any associated injuries.  She did not lose consciousness or strike her head when she fell.  No other injuries are identified.    PHYSICAL EXAMINATION:   GENERAL:  We have a pleasant, somewhat anxious and distressed, middle-aged female lying somewhat comfortably in bed.  She is alert and oriented x 3.  ORTHOPEDIC:  Examination is limited to her right ankle and lower extremity.  There are 3  primary lacerations over the lateral ankle region.  The first is approximately 2.5 cm and runs obliquely over the anterolateral aspect of the ankle.  It goes down through the subcutaneous tissues but does not appear to penetrate the capsule.  The second laceration is a large flap laceration measuring approximately 3 x 5 cm.  This also goes down through the subcutaneous tissues but does not appear to involve the deeper structures.  There is a small area of deeper puncturing approaching the peroneal tendons but the tendons could not be visualized at the depth of this wound.  Finally, the third laceration is a more stellate  laceration over the dorsolateral aspect of the right midfoot.  This measures approximately 1.5 x 2 cm in greatest dimension.  It also does not appear to involve the deeper structures below the subcutaneous tissues.  She is able to actively dorsiflex and plantarflex all toes although with discomfort.  She is also able to actively dorsiflex and plantarflex her ankle, again with discomfort.  Sensation is present in the first web space as well as over the great toe and over the 5th toe.  The ER physician had already  injected the wounds with some numbing medication, so the superficial peroneal nerve is difficult to assess.  She has good capillary refill to all digits.    IMPRESSION:  Multiple lacerations, right ankle, secondary to dog bites.   PLAN:  The treatment options were discussed with the patient.  The patient is reassured that the wounds do not appear to involve the ankle joint or deeper structures.  The Emergency Room physician has agreed to irrigate and loosely close the wounds.  He is advised to close the flap wound over a Penrose drain as a precaution.  She will be placed on p.o. antibiotics and follow up in the next 2-3 days with either myself or one of the podiatrists.   Thank you for asking me to participate in the care of this most pleasant yet unfortunate woman.  I will be happy to keep you abreast of her progress.   ____________________________ Shela Commons. Derald Macleod, MD jjp:kc D: 08/05/2014 19:57:39 ET T: 08/05/2014 04:54:09  ET JOB#: [id number]  cc: Maryagnes Amos, MD, <Dictator> Maryagnes Amos MD ELECTRONICALLY SIGNED 08/06/2014 16:48

## 2014-08-11 ENCOUNTER — Encounter: Payer: Self-pay | Admitting: Infectious Diseases

## 2014-08-11 DIAGNOSIS — M1990 Unspecified osteoarthritis, unspecified site: Secondary | ICD-10-CM | POA: Diagnosis present

## 2014-08-11 DIAGNOSIS — I10 Essential (primary) hypertension: Secondary | ICD-10-CM | POA: Diagnosis present

## 2014-08-11 DIAGNOSIS — F329 Major depressive disorder, single episode, unspecified: Secondary | ICD-10-CM | POA: Diagnosis present

## 2014-08-11 DIAGNOSIS — E039 Hypothyroidism, unspecified: Secondary | ICD-10-CM | POA: Diagnosis present

## 2014-08-11 DIAGNOSIS — K219 Gastro-esophageal reflux disease without esophagitis: Secondary | ICD-10-CM | POA: Diagnosis present

## 2014-08-11 DIAGNOSIS — D649 Anemia, unspecified: Secondary | ICD-10-CM | POA: Diagnosis present

## 2014-08-11 DIAGNOSIS — F32A Depression, unspecified: Secondary | ICD-10-CM | POA: Diagnosis present

## 2014-08-11 LAB — COMPREHENSIVE METABOLIC PANEL
ALK PHOS: 61 U/L (ref 38–126)
ALT: 30 U/L (ref 14–54)
AST: 31 U/L (ref 15–41)
Albumin: 3.1 g/dL — ABNORMAL LOW (ref 3.5–5.0)
Anion gap: 5 (ref 5–15)
BILIRUBIN TOTAL: 0.4 mg/dL (ref 0.3–1.2)
BUN: 17 mg/dL (ref 6–20)
CO2: 26 mmol/L (ref 22–32)
Calcium: 7.9 mg/dL — ABNORMAL LOW (ref 8.9–10.3)
Chloride: 106 mmol/L (ref 101–111)
Creatinine, Ser: 0.72 mg/dL (ref 0.44–1.00)
GFR calc Af Amer: >60 mL/min (ref >60)
GFR calc non Af Amer: >60 mL/min (ref >60)
GLUCOSE: 114 mg/dL — AB (ref 65–99)
POTASSIUM: 3.4 mmol/L — AB (ref 3.5–5.1)
SODIUM: 137 mmol/L (ref 135–145)
Total Protein: 6.7 g/dL (ref 6.5–8.1)

## 2014-08-11 LAB — CBC
HCT: 25.4 % — ABNORMAL LOW (ref 35.0–47.0)
HEMOGLOBIN: 8 g/dL — AB (ref 12.0–16.0)
MCH: 23.1 pg — AB (ref 26.0–34.0)
MCHC: 31.5 g/dL — ABNORMAL LOW (ref 32.0–36.0)
MCV: 73.4 fL — ABNORMAL LOW (ref 80.0–100.0)
Platelets: 240 10*3/uL (ref 150–440)
RBC: 3.46 MIL/uL — ABNORMAL LOW (ref 3.80–5.20)
RDW: 18 % — ABNORMAL HIGH (ref 11.5–14.5)
WBC: 4.1 10*3/uL (ref 3.6–11.0)

## 2014-08-11 MED ORDER — ONDANSETRON HCL 4 MG/2ML IJ SOLN
4.0000 mg | Freq: Four times a day (QID) | INTRAMUSCULAR | Status: DC | PRN
  Administered 2014-08-11 – 2014-08-13 (×6): 4 mg via INTRAVENOUS
  Filled 2014-08-11 (×8): qty 2

## 2014-08-11 MED ORDER — ONDANSETRON HCL 4 MG PO TABS
4.0000 mg | ORAL_TABLET | Freq: Four times a day (QID) | ORAL | Status: DC | PRN
  Administered 2014-08-14 – 2014-08-15 (×3): 4 mg via ORAL
  Filled 2014-08-11 (×4): qty 1

## 2014-08-11 MED ORDER — SODIUM CHLORIDE 0.9 % IV SOLN
3.0000 g | Freq: Four times a day (QID) | INTRAVENOUS | Status: DC
  Administered 2014-08-11 – 2014-08-15 (×18): 3 g via INTRAVENOUS
  Filled 2014-08-11 (×26): qty 3

## 2014-08-11 MED ORDER — TRAZODONE HCL 100 MG PO TABS
100.0000 mg | ORAL_TABLET | Freq: Every day | ORAL | Status: DC
  Administered 2014-08-11 – 2014-08-15 (×6): 100 mg via ORAL
  Filled 2014-08-11 (×6): qty 1

## 2014-08-11 MED ORDER — OXYCODONE-ACETAMINOPHEN 5-325 MG PO TABS
1.0000 | ORAL_TABLET | Freq: Four times a day (QID) | ORAL | Status: DC | PRN
  Administered 2014-08-11 – 2014-08-16 (×16): 1 via ORAL
  Filled 2014-08-11 (×16): qty 1

## 2014-08-11 MED ORDER — PANTOPRAZOLE SODIUM 40 MG PO TBEC
40.0000 mg | DELAYED_RELEASE_TABLET | Freq: Every day | ORAL | Status: DC
  Administered 2014-08-11 – 2014-08-16 (×6): 40 mg via ORAL
  Filled 2014-08-11 (×6): qty 1

## 2014-08-11 MED ORDER — POTASSIUM CHLORIDE CRYS ER 20 MEQ PO TBCR
EXTENDED_RELEASE_TABLET | ORAL | Status: AC
  Filled 2014-08-11: qty 1

## 2014-08-11 MED ORDER — VENLAFAXINE HCL 25 MG PO TABS
50.0000 mg | ORAL_TABLET | Freq: Two times a day (BID) | ORAL | Status: DC
  Administered 2014-08-11 – 2014-08-16 (×11): 50 mg via ORAL
  Filled 2014-08-11 (×3): qty 2
  Filled 2014-08-11 (×2): qty 1
  Filled 2014-08-11 (×2): qty 2
  Filled 2014-08-11: qty 1
  Filled 2014-08-11 (×6): qty 2

## 2014-08-11 MED ORDER — POTASSIUM CHLORIDE CRYS ER 20 MEQ PO TBCR
40.0000 meq | EXTENDED_RELEASE_TABLET | Freq: Once | ORAL | Status: AC
  Administered 2014-08-11: 40 meq via ORAL
  Filled 2014-08-11: qty 2

## 2014-08-11 MED ORDER — LEVOTHYROXINE SODIUM 112 MCG PO TABS
112.0000 ug | ORAL_TABLET | Freq: Every day | ORAL | Status: DC
  Administered 2014-08-11 – 2014-08-16 (×5): 112 ug via ORAL
  Filled 2014-08-11 (×5): qty 1

## 2014-08-11 MED ORDER — ASPIRIN EC 81 MG PO TBEC
81.0000 mg | DELAYED_RELEASE_TABLET | Freq: Every day | ORAL | Status: DC
  Administered 2014-08-11 – 2014-08-16 (×6): 81 mg via ORAL
  Filled 2014-08-11 (×6): qty 1

## 2014-08-11 MED ORDER — LISINOPRIL 10 MG PO TABS
10.0000 mg | ORAL_TABLET | Freq: Every day | ORAL | Status: DC
Start: 2014-08-11 — End: 2014-08-16
  Administered 2014-08-11 – 2014-08-16 (×6): 10 mg via ORAL
  Filled 2014-08-11 (×6): qty 1

## 2014-08-11 MED ORDER — MORPHINE SULFATE 2 MG/ML IJ SOLN
1.0000 mg | INTRAMUSCULAR | Status: DC | PRN
  Administered 2014-08-11: 1 mg via INTRAVENOUS
  Filled 2014-08-11: qty 1

## 2014-08-11 MED ORDER — ENOXAPARIN SODIUM 40 MG/0.4ML ~~LOC~~ SOLN
40.0000 mg | SUBCUTANEOUS | Status: DC
  Administered 2014-08-11 – 2014-08-15 (×5): 40 mg via SUBCUTANEOUS
  Filled 2014-08-11 (×6): qty 0.4

## 2014-08-11 MED ORDER — FERROUS SULFATE 325 (65 FE) MG PO TABS
325.0000 mg | ORAL_TABLET | Freq: Two times a day (BID) | ORAL | Status: DC
  Administered 2014-08-11 – 2014-08-14 (×8): 325 mg via ORAL
  Filled 2014-08-11 (×12): qty 1

## 2014-08-11 NOTE — Progress Notes (Addendum)
Pt rested quietly most of day. Pain relieved with oral pain meds. Applied wet to dry gauze bandage. VSS during this shift. IV ABX infused.

## 2014-08-11 NOTE — Progress Notes (Signed)
Pt started on IV ABX. Elevate right foot. Monitor swelling and redness for any changes. Medicate for pain as needed.

## 2014-08-11 NOTE — Progress Notes (Signed)
ANTIBIOTIC CONSULT NOTE - INITIAL  Pharmacy Consult for Unasyn Indication: Wound Infection - Dog bite  Allergies  Allergen Reactions  . Sulfa Antibiotics Rash    Patient Measurements: Height: 5\' 8"  (172.7 cm) Weight: 184 lb (83.462 kg) IBW/kg (Calculated) : 63.9  Vital Signs: Temp: 98.2 F (36.8 C) (05/02 0058) Temp Source: Oral (05/02 0058) BP: 131/76 mmHg (05/02 0058) Pulse Rate: 72 (05/02 0058) Intake/Output from previous day: 05/01 0701 - 05/02 0700 In: -  Out: 200 [Urine:200] Intake/Output from this shift: Total I/O In: -  Out: 200 [Urine:200]  Labs:  Recent Labs  08/10/14 1838  WBC 4.5  HGB 8.6*  PLT 285  CREATININE 0.73   Estimated Creatinine Clearance: 101.6 mL/min (by C-G formula based on Cr of 0.73).   Microbiology: No results found for this or any previous visit (from the past 720 hour(s)).  Medical History: Past Medical History  Diagnosis Date  . Arthritis   . Hypertension   . Anxiety   . Insomnia   . GERD (gastroesophageal reflux disease)   . Depression   . Hypothyroidism     Medications:  Scheduled:  . ampicillin-sulbactam (UNASYN) IV  3 g Intravenous Q6H  . aspirin EC  81 mg Oral Daily  . enoxaparin (LOVENOX) injection  40 mg Subcutaneous Q24H  . levothyroxine  112 mcg Oral QAC breakfast  . lisinopril  10 mg Oral Daily  . pantoprazole  40 mg Oral Daily  . traZODone  100 mg Oral QHS  . venlafaxine  50 mg Oral BID WC   Infusions:   Assessment: Patient admitted for IV antibiotics after failing outpatient treatment for dog bite (on amoxicillin 500mg  BID)  Plan:  Ordered unasyn 3gm IV Q6H  Follow up culture results  Nur Krasinski C 08/11/2014,2:13 AM

## 2014-08-11 NOTE — Plan of Care (Signed)
Pt admitted with cellulitis to right foot following a dog attack on Tuesday.  Has open wounds that have been sutured.  Lives at home with husband and children. Wants to be called Marcia Pugh. PMH: HTN, anemia, GERD, depression, hypothyroidism Tx: IV ABX, elevated extremity. Moderate Fall Risk. Needs assist with ambulation. Has walker in room from home.  Uses crutches at home.

## 2014-08-11 NOTE — Progress Notes (Signed)
Patient ID: Marcia Pugh, female   DOB: [birth year], 44 y.o.   MRN: [id number] [facility] PROGRESS NOTE  BRAILEY BUESCHER KVQ:[id number] DOB: [birth year] DOA: 2014/08/25 PCP: No primary care provider on file.  HPI/Subjective: Patient has 7 out of 10 pain in her right foot. She was bitten by a dog last Tuesday. It was sutured in the emergency room at that time. She followed up with Dr. Ether Griffins podiatry. It started getting red and more painful and came to the ER for further evaluation. Was admitted with Dr. bite cellulitis.  Objective: Filed Vitals:   08/11/14 0817  BP: 135/98  Pulse: 72  Temp: 98 F (36.7 C)  Resp: 18    Intake/Output Summary (Last 24 hours) at 08/11/14 1029 Last data filed at 08/11/14 0800  Gross per 24 hour  Intake    340 ml  Output    200 ml  Net    140 ml   Filed Weights   Aug 25, 2014 1912 08/11/14 0100  Weight: 79.379 kg (175 lb) 83.462 kg (184 lb)    ROS: Review of Systems  Constitutional: Negative for fever and chills.  Eyes: Negative for blurred vision.  Respiratory: Negative for cough and shortness of breath.   Cardiovascular: Negative for chest pain.  Gastrointestinal: Positive for nausea. Negative for vomiting, diarrhea and constipation.  Genitourinary: Negative for dysuria.  Musculoskeletal: Positive for joint pain.  Neurological: Negative for dizziness and headaches.   Exam: Physical Exam  HENT:  Nose: No mucosal edema.  Mouth/Throat: No oropharyngeal exudate or posterior oropharyngeal edema.  Eyes: Conjunctivae, EOM and lids are normal. Pupils are equal, round, and reactive to light.  Neck: No JVD present. Carotid bruit is not present. No edema present. No thyroid mass and no thyromegaly present.  Cardiovascular: S1 normal and S2 normal.  Exam reveals no gallop.   No murmur heard. Pulses:      Dorsalis pedis pulses are 2+ on the right side, and 2+ on the left side.  Respiratory: No respiratory distress. She has no wheezes. She  has no rhonchi. She has no rales.  GI: Soft. Bowel sounds are normal. There is no tenderness.  Musculoskeletal:       Right ankle: She exhibits decreased range of motion, swelling and laceration.  Lymphadenopathy:    She has no cervical adenopathy.  Neurological: She is alert. No cranial nerve deficit.  Skin: Skin is warm. Laceration noted. There is erythema. Nails show no clubbing.  Psychiatric: She has a normal mood and affect.   skin exam continued: The patient has numerous lacerations which were sutured. Drainage from one of the lacerations with opening of that laceration.    Data Reviewed: Basic Metabolic Panel:  Recent Labs Lab 25-Aug-2014 1838 08/11/14 0413  NA 138 137  K 3.4* 3.4*  CL 106 106  CO2 25 26  GLUCOSE 94 114*  BUN 16 17  CREATININE 0.73 0.72  CALCIUM 8.3* 7.9*   Liver Function Tests:  Recent Labs Lab 08/25/14 1838 08/11/14 0413  AST 40 31  ALT 34 30  ALKPHOS 54 61  BILITOT 0.6 0.4  PROT 7.8 6.7  ALBUMIN 3.7 3.1*   CBC:  Recent Labs Lab 08-25-14 1838 08/11/14 0413  WBC 4.5 4.1  NEUTROABS 2.7  --   HGB 8.6* 8.0*  HCT 28.0* 25.4*  MCV 73.1* 73.4*  PLT 285 240     Studies: Dg Foot Complete Right  Aug 25, 2014   CLINICAL DATA:  Dog bite 2 days ago.  EXAM: RIGHT FOOT COMPLETE - 3+ VIEW  COMPARISON:  08/05/2014  FINDINGS: There are mild soft tissue irregularities in the location of the previously observed laceration. There is no soft tissue gas. There is no bony abnormality. There is no radiopaque foreign body.  IMPRESSION: Negative for fracture, dislocation, radiopaque foreign body or soft tissue gas.   Electronically Signed   By: Ellery Plunk M.D.   On: 08/10/2014 21:35    Scheduled Meds: . ampicillin-sulbactam (UNASYN) IV  3 g Intravenous Q6H  . aspirin EC  81 mg Oral Daily  . enoxaparin (LOVENOX) injection  40 mg Subcutaneous Q24H  . ferrous sulfate  325 mg Oral BID WC  . levothyroxine  112 mcg Oral QAC breakfast  . lisinopril  10 mg  Oral Daily  . pantoprazole  40 mg Oral Daily  . potassium chloride SA      . traZODone  100 mg Oral QHS  . venlafaxine  50 mg Oral BID WC   Continuous Infusions:   Assessment/Plan: Principal Problem:   Cellulitis of foot, right Active Problems:   HTN (hypertension)   Anemia   GERD (gastroesophageal reflux disease)   Depression   Arthritis   Hypothyroidism   1. Dog bite cellulitis of the right foot. IV Unasyn will be continued. We'll get a podiatry consultation to evaluate whether the sutures need to be removed. Patient has poor range of motion of the right ankle and pain on minimal palpation. 2. Essential hypertension-stable on current medications. 3. Anemia likely iron deficiency-will start ferrous sulfate. The patient does have heavy menses and no other signs of bleeding. 4. Gastroesophageal reflux disease without esophagitis-patient takes Protonix at home will continue 5.   hypothyroidism unspecified continue levothyroxine. 6.   Hypokalemia- replace orally. Code Status:     Code Status Orders        Start     Ordered   08/11/14 0050  Full code   Continuous     08/11/14 0049     Family Communication: Family at bedside Disposition Plan: Home  Consultants:  Podiatry and infectious disease       Time spent: 25 minutes    Alford Highland  [facility]

## 2014-08-11 NOTE — Care Management Note (Signed)
Case Management Note  Patient Details  Name: Marcia Pugh MRN: [id number] Date of Birth: September 26, 1970  Subjective/Objective:                    Action/Plan:   Expected Discharge Date:  08/13/14               Expected Discharge Plan:     In-House Referral:     Discharge planning Services  CM Consult  Post Acute Care Choice:    Choice offered to:     DME Arranged:    DME Agency:     HH Arranged:    [place] Agency:     Status of Service:     Medicare Important Message Given:  Yes Date Medicare IM Given:  08/11/14 Medicare IM give by:  Marshell Garfinkel Date Additional Medicare IM Given:    Additional Medicare Important Message give by:     If discussed at [place] of Stay Meetings, dates discussed:    Additional Comments: Met with patient; husband was sleeping at bedside. She states she is is followed at [facility] and they assist her with medication. She states she has crutches available for use if needed. RNCM will continue to follow for [facility] need in case IV ABX are needed.  Marshell Garfinkel, RN 08/11/2014, 9:31 AM

## 2014-08-11 NOTE — Consult Note (Signed)
Reason for Consult:Marcia Pugh had dog bite on right ankle laterally last week and was seen and treated in ED last Tuesday.  She was placed on oral antibiotics at that time and was followed by Dr. Vickki Muff on Friday.  At that time she appeared stable.  She states she worsened considerably on Sunday and went to ER and was admitted for antibiotic therapy.  She states she is much better with less redness today.  Referring Physician: Hospitalist  Marcia Pugh is an 44 y.o. female.  HPI: see above  Past Medical History  Diagnosis Date  . Arthritis   . Hypertension   . Anxiety   . Insomnia   . GERD (gastroesophageal reflux disease)   . Depression   . Hypothyroidism   . RA (rheumatoid arthritis)     On enbrel - follows with Dr Lindon Romp    Past Surgical History  Procedure Laterality Date  . Cholecystectomy    . Tubal ligation      Family History  Problem Relation Age of Onset  . Lymphoma Mother   . Diabetes type II Other     Social History:  reports that she has never smoked. She does not have any smokeless tobacco history on file. She reports that she does not drink alcohol or use illicit drugs.  Allergies:  Allergies  Allergen Reactions  . Sulfa Antibiotics Rash    Medications: I have reviewed the patient's current medications.  Results for orders placed or performed during the hospital encounter of 08/10/14 (from the past 48 hour(s))  Comprehensive metabolic panel     Status: Abnormal   Collection Time: 08/10/14  6:38 PM  Result Value Ref Range   Sodium 138 135 - 145 mmol/L   Potassium 3.4 (L) 3.5 - 5.1 mmol/L   Chloride 106 101 - 111 mmol/L   CO2 25 22 - 32 mmol/L   Glucose, Bld 94 65 - 99 mg/dL   BUN 16 6 - 20 mg/dL   Creatinine, Ser 0.73 0.44 - 1.00 mg/dL   Calcium 8.3 (L) 8.9 - 10.3 mg/dL   Total Protein 7.8 6.5 - 8.1 g/dL   Albumin 3.7 3.5 - 5.0 g/dL   AST 40 15 - 41 U/L   ALT 34 14 - 54 U/L   Alkaline Phosphatase 54 38 - 126 U/L   Total Bilirubin 0.6 0.3 - 1.2  mg/dL   GFR calc non Af Amer >60 >60 mL/min   GFR calc Af Amer >60 >60 mL/min    Comment: (NOTE) The eGFR has been calculated using the CKD EPI equation. This calculation has not been validated in all clinical situations. eGFR's persistently <90 mL/min signify possible Chronic Kidney Disease.    Anion gap 7 5 - 15  CBC with Differential     Status: Abnormal   Collection Time: 08/10/14  6:38 PM  Result Value Ref Range   WBC 4.5 3.6 - 11.0 K/uL   RBC 3.83 3.80 - 5.20 MIL/uL   Hemoglobin 8.6 (L) 12.0 - 16.0 g/dL   HCT 28.0 (L) 35.0 - 47.0 %   MCV 73.1 (L) 80.0 - 100.0 fL   MCH 22.6 (L) 26.0 - 34.0 pg   MCHC 30.9 (L) 32.0 - 36.0 g/dL   RDW 17.6 (H) 11.5 - 14.5 %   Platelets 285 150 - 440 K/uL   Neutrophils Relative % 59% %   Neutro Abs 2.7 1.4 - 6.5 K/uL   Lymphocytes Relative 24% %   Lymphs Abs 1.1 1.0 -  3.6 K/uL   Monocytes Relative 12% %   Monocytes Absolute 0.5 0.2 - 0.9 K/uL   Eosinophils Relative 4% %   Eosinophils Absolute 0.2 0 - 0.7 K/uL   Basophils Relative 1% %   Basophils Absolute 0.0 0 - 0.1 K/uL  Comprehensive metabolic panel     Status: Abnormal   Collection Time: 08/11/14  4:13 AM  Result Value Ref Range   Sodium 137 135 - 145 mmol/L   Potassium 3.4 (L) 3.5 - 5.1 mmol/L   Chloride 106 101 - 111 mmol/L   CO2 26 22 - 32 mmol/L   Glucose, Bld 114 (H) 65 - 99 mg/dL   BUN 17 6 - 20 mg/dL   Creatinine, Ser 0.72 0.44 - 1.00 mg/dL   Calcium 7.9 (L) 8.9 - 10.3 mg/dL   Total Protein 6.7 6.5 - 8.1 g/dL   Albumin 3.1 (L) 3.5 - 5.0 g/dL   AST 31 15 - 41 U/L   ALT 30 14 - 54 U/L   Alkaline Phosphatase 61 38 - 126 U/L   Total Bilirubin 0.4 0.3 - 1.2 mg/dL   GFR calc non Af Amer >60 >60 mL/min   GFR calc Af Amer >60 >60 mL/min    Comment: (NOTE) The eGFR has been calculated using the CKD EPI equation. This calculation has not been validated in all clinical situations. eGFR's persistently <90 mL/min signify possible Chronic Kidney Disease.    Anion gap 5 5 - 15   CBC     Status: Abnormal   Collection Time: 08/11/14  4:13 AM  Result Value Ref Range   WBC 4.1 3.6 - 11.0 K/uL   RBC 3.46 (L) 3.80 - 5.20 MIL/uL   Hemoglobin 8.0 (L) 12.0 - 16.0 g/dL   HCT 25.4 (L) 35.0 - 47.0 %   MCV 73.4 (L) 80.0 - 100.0 fL   MCH 23.1 (L) 26.0 - 34.0 pg   MCHC 31.5 (L) 32.0 - 36.0 g/dL   RDW 18.0 (H) 11.5 - 14.5 %   Platelets 240 150 - 440 K/uL    Dg Foot Complete Right  08/10/2014   CLINICAL DATA:  Dog bite 2 days ago.  EXAM: RIGHT FOOT COMPLETE - 3+ VIEW  COMPARISON:  08/05/2014  FINDINGS: There are mild soft tissue irregularities in the location of the previously observed laceration. There is no soft tissue gas. There is no bony abnormality. There is no radiopaque foreign body.  IMPRESSION: Negative for fracture, dislocation, radiopaque foreign body or soft tissue gas.   Electronically Signed   By: Andreas Newport M.D.   On: 08/10/2014 21:35    ROS Blood pressure 118/63, pulse 66, temperature 98.1 F (36.7 C), temperature source Oral, resp. rate 18, height 5' 8"  (1.727 m), weight 83.462 kg (184 lb), last menstrual period 08/10/2014, SpO2 98 %. Physical Exam  Assessment/Plan: Continue ID follow by Dr. Ola Spurr.  Start dressing changes.  [name] G 08/11/2014, 6:06 PM

## 2014-08-11 NOTE — Evaluation (Signed)
Physical Therapy Evaluation Patient Details Name: Marcia Pugh MRN: [id number] DOB: 01-16-1971 Today's Date: 08/11/2014   History of Present Illness  Marcia Pugh is a 44 y.o. female Presents to the emergency room with the complaints of worsening of the redness pain and swelling of the right foot following the dog bite injuries she sustained on 08/05/2014. Patient mentioned that she was bit her neighbor's dog on 26th of April 2016 for which she came to the emergency room was was evaluated by the ED physician and received sutures for the wounds on the right foot and was sent home on oral antibiotics. She has been using oral antibiotics since then but continued to have that increasing redness and swelling and the pain right foot for which she came to the emergency room tonight and was evaluated by the ED physician and was found to have significant cellulitis with wound infection of the right foot.  Clinical Impression  Pt presents with limited ambulation from R ankle wound with celluilitis and pain.  Pt has a rollator walker and crutches that she is using for ambulation.  Pt has 4 steps at home that she is able to ascend/descend with either scooting on her bottom or with assist from her husband.  Pt shows modified independence with functional mobility with no further PT needs at this time.   Today's intervention include:  Education regarding ankle mobility during healing process; gait training, therex.    Follow Up Recommendations No PT follow up    Equipment Recommendations  Rolling walker with 5" wheels;Crutches    Recommendations for Other Services       Precautions / Restrictions Precautions Precautions: None Restrictions Weight Bearing Restrictions: No      Mobility  Bed Mobility Overal bed mobility: Independent                Transfers Overall transfer level: Independent                  Ambulation/Gait Ambulation/Gait assistance: Modified independent  (Device/Increase time) Ambulation Distance (Feet): 10 Feet Assistive device: 4-wheeled walker       General Gait Details: Uses 4-wheel walker with seat, placing R knee on seat keeping foot off floor for comfort; also able to ambulate with step-to gait pattern with 4-wheel walker with foot flat on floor.  Stairs            Wheelchair Mobility    Modified Rankin (Stroke Patients Only)       Balance Overall balance assessment: Modified Independent                                           Pertinent Vitals/Pain      Home Living Family/patient expects to be discharged to:: Private residence Living Arrangements: Spouse/significant other Available Help at Discharge: Family Type of Home: House Home Access: Stairs to enter   Secretary/administrator of Steps: 4 Home Layout: One level Home Equipment: Environmental consultant - 4 wheels;Crutches      Prior Function Level of Independence: Independent               Hand Dominance        Extremity/Trunk Assessment   Upper Extremity Assessment: Overall WFL for tasks assessed           Lower Extremity Assessment: RLE deficits/detail RLE Deficits / Details: ankle AROM limited by 50% due to  swelling and pain       Communication   Communication: No difficulties  Cognition Arousal/Alertness: Awake/alert Behavior During Therapy: WFL for tasks assessed/performed Overall Cognitive Status: Within Functional Limits for tasks assessed                      General Comments General comments (skin integrity, edema, etc.): R ankle laceration with sutures open to air, draining,  elevated on bed.    Exercises Other Exercises Other Exercises: R LE ankle pump x 5 reps; inversion/eversion x 5 reps.      Assessment/Plan    PT Assessment Patent does not need any further PT services  PT Diagnosis Difficulty walking;Abnormality of gait;Acute pain   PT Problem List    PT Treatment Interventions     PT Goals  (Current goals can be found in the Care Plan section)      Frequency     Barriers to discharge        Co-evaluation               End of Session   Activity Tolerance: Patient limited by pain Patient left: in bed;with family/visitor present           Time: 1000-1030 PT Time Calculation (min) (ACUTE ONLY): 30 min   Charges:   PT Evaluation $Initial PT Evaluation Tier I: 1 Procedure PT Treatments $Therapeutic Activity: 8-22 mins   PT G Codes:        Kiaria Quinnell A Miguel Christiana Aug 19, 2014, 11:22 AM

## 2014-08-11 NOTE — Consult Note (Signed)
[facility]        Reason for Consult: Cellulitis after dog bite   Referring Physician:Dr Reece Levy  Principal Problem:   Cellulitis of foot, right Active Problems:   HTN (hypertension)   Anemia   GERD (gastroesophageal reflux disease)   Depression   Arthritis   Hypothyroidism    HPI: Marcia Pugh is a 44 y.o. female admitted 5/2 with worsening cellulitis after a dog bite 4/26.  Had been seen in ED and had washout and sutures with abx given at home. Foot worsened and represented to ED. Admitted and on unasyn. Foot a little better today   Past Medical History  Diagnosis Date   Arthritis    Hypertension    Anxiety    Insomnia    GERD (gastroesophageal reflux disease)    Depression    Hypothyroidism     Allergies:  Allergies  Allergen Reactions   Sulfa Antibiotics Rash    Current antibiotics: Antibiotics Given (last 72 hours)    Date/Time Action Medication Dose Rate   08/11/14 0302 Given   Ampicillin-Sulbactam (UNASYN) 3 g in sodium chloride 0.9 % 100 mL IVPB 3 g 100 mL/hr   08/11/14 0746 Given   Ampicillin-Sulbactam (UNASYN) 3 g in sodium chloride 0.9 % 100 mL IVPB 3 g 100 mL/hr   08/11/14 1356 Given   Ampicillin-Sulbactam (UNASYN) 3 g in sodium chloride 0.9 % 100 mL IVPB 3 g 100 mL/hr      Antibiotics Given (last 72 hours)    Date/Time Action Medication Dose Rate   08/11/14 0302 Given   Ampicillin-Sulbactam (UNASYN) 3 g in sodium chloride 0.9 % 100 mL IVPB 3 g 100 mL/hr   08/11/14 0746 Given   Ampicillin-Sulbactam (UNASYN) 3 g in sodium chloride 0.9 % 100 mL IVPB 3 g 100 mL/hr   08/11/14 1356 Given   Ampicillin-Sulbactam (UNASYN) 3 g in sodium chloride 0.9 % 100 mL IVPB 3 g 100 mL/hr       MEDICATIONS:  ampicillin-sulbactam (UNASYN) IV  3 g Intravenous Q6H   aspirin EC  81 mg Oral Daily   enoxaparin (LOVENOX) injection  40 mg Subcutaneous Q24H   ferrous sulfate  325 mg Oral BID WC   levothyroxine  112 mcg Oral  QAC breakfast   lisinopril  10 mg Oral Daily   pantoprazole  40 mg Oral Daily   potassium chloride SA       traZODone  100 mg Oral QHS   venlafaxine  50 mg Oral BID WC    History  Substance Use Topics   Smoking status: Never Smoker    Smokeless tobacco: Not on file   Alcohol Use: No    Family History  Problem Relation Age of Onset   Lymphoma Mother    Diabetes type II Other     Review of Systems - 11 systems reviewed and negative per HPI   OBJECTIVE: Temp:  [98 F (36.7 C)-98.2 F (36.8 C)] 98 F (36.7 C) (05/02 0817) Pulse Rate:  [71-74] 72 (05/02 0817) Resp:  [18-20] 18 (05/02 0817) BP: (131-179)/(76-98) 135/98 mmHg (05/02 0817) SpO2:  [96 %-100 %] 100 % (05/02 0817) Weight:  [79.379 kg (175 lb)-83.462 kg (184 lb)] 83.462 kg (184 lb) (05/02 0100) General appearance: alert Head: Normocephalic, without obvious abnormality, atraumatic Eyes: conjunctivae/corneas clear. PERRL, EOM's intact. Fundi benign. Ears: normal TM's and external ear canals both ears Nose: Nares normal. Septum midline. Mucosa normal. No drainage or sinus tenderness. Throat:  lips, mucosa, and tongue normal; teeth and gums normal Neck: no adenopathy, no carotid bruit, no JVD, supple, symmetrical, trachea midline and thyroid not enlarged, symmetric, no tenderness/mass/nodules Back: symmetric, no curvature. ROM normal. No CVA tenderness. Resp: clear to auscultation bilaterally Chest wall: no tenderness Cardio: regular rate and rhythm, S1, S2 normal, no murmur, click, rub or gallop GI: soft, non-tender; bowel sounds normal; no masses,  no organomegaly Extremities: extremities normal, atraumatic, no cyanosis or edema Pulses: 2+ and symmetric Skin: Skin color, texture, turgor normal. No rashes or lesions or R foot with several sutures in lacerations, scabs over the sutures, min drainage, is warm and tender. mild erythema  LABS: Results for orders placed or performed during the hospital  encounter of 08/10/14 (from the past 48 hour(s))  Comprehensive metabolic panel     Status: Abnormal   Collection Time: 08/10/14  6:38 PM  Result Value Ref Range   Sodium 138 135 - 145 mmol/L   Potassium 3.4 (L) 3.5 - 5.1 mmol/L   Chloride 106 101 - 111 mmol/L   CO2 25 22 - 32 mmol/L   Glucose, Bld 94 65 - 99 mg/dL   BUN 16 6 - 20 mg/dL   Creatinine, Ser 0.73 0.44 - 1.00 mg/dL   Calcium 8.3 (L) 8.9 - 10.3 mg/dL   Total Protein 7.8 6.5 - 8.1 g/dL   Albumin 3.7 3.5 - 5.0 g/dL   AST 40 15 - 41 U/L   ALT 34 14 - 54 U/L   Alkaline Phosphatase 54 38 - 126 U/L   Total Bilirubin 0.6 0.3 - 1.2 mg/dL   GFR calc non Af Amer >60 >60 mL/min   GFR calc Af Amer >60 >60 mL/min    Comment: (NOTE) The eGFR has been calculated using the CKD EPI equation. This calculation has not been validated in all clinical situations. eGFR's persistently <90 mL/min signify possible Chronic Kidney Disease.    Anion gap 7 5 - 15  CBC with Differential     Status: Abnormal   Collection Time: 08/10/14  6:38 PM  Result Value Ref Range   WBC 4.5 3.6 - 11.0 K/uL   RBC 3.83 3.80 - 5.20 MIL/uL   Hemoglobin 8.6 (L) 12.0 - 16.0 g/dL   HCT 28.0 (L) 35.0 - 47.0 %   MCV 73.1 (L) 80.0 - 100.0 fL   MCH 22.6 (L) 26.0 - 34.0 pg   MCHC 30.9 (L) 32.0 - 36.0 g/dL   RDW 17.6 (H) 11.5 - 14.5 %   Platelets 285 150 - 440 K/uL   Neutrophils Relative % 59% %   Neutro Abs 2.7 1.4 - 6.5 K/uL   Lymphocytes Relative 24% %   Lymphs Abs 1.1 1.0 - 3.6 K/uL   Monocytes Relative 12% %   Monocytes Absolute 0.5 0.2 - 0.9 K/uL   Eosinophils Relative 4% %   Eosinophils Absolute 0.2 0 - 0.7 K/uL   Basophils Relative 1% %   Basophils Absolute 0.0 0 - 0.1 K/uL  Comprehensive metabolic panel     Status: Abnormal   Collection Time: 08/11/14  4:13 AM  Result Value Ref Range   Sodium 137 135 - 145 mmol/L   Potassium 3.4 (L) 3.5 - 5.1 mmol/L   Chloride 106 101 - 111 mmol/L   CO2 26 22 - 32 mmol/L   Glucose, Bld 114 (H) 65 - 99 mg/dL   BUN  17 6 - 20 mg/dL   Creatinine, Ser 0.72 0.44 - 1.00 mg/dL  Calcium 7.9 (L) 8.9 - 10.3 mg/dL   Total Protein 6.7 6.5 - 8.1 g/dL   Albumin 3.1 (L) 3.5 - 5.0 g/dL   AST 31 15 - 41 U/L   ALT 30 14 - 54 U/L   Alkaline Phosphatase 61 38 - 126 U/L   Total Bilirubin 0.4 0.3 - 1.2 mg/dL   GFR calc non Af Amer >60 >60 mL/min   GFR calc Af Amer >60 >60 mL/min    Comment: (NOTE) The eGFR has been calculated using the CKD EPI equation. This calculation has not been validated in all clinical situations. eGFR's persistently <90 mL/min signify possible Chronic Kidney Disease.    Anion gap 5 5 - 15  CBC     Status: Abnormal   Collection Time: 08/11/14  4:13 AM  Result Value Ref Range   WBC 4.1 3.6 - 11.0 K/uL   RBC 3.46 (L) 3.80 - 5.20 MIL/uL   Hemoglobin 8.0 (L) 12.0 - 16.0 g/dL   HCT 25.4 (L) 35.0 - 47.0 %   MCV 73.4 (L) 80.0 - 100.0 fL   MCH 23.1 (L) 26.0 - 34.0 pg   MCHC 31.5 (L) 32.0 - 36.0 g/dL   RDW 18.0 (H) 11.5 - 14.5 %   Platelets 240 150 - 440 K/uL   MICRO:  IMAGING: Dg Foot Complete Right  08/10/2014   CLINICAL DATA:  Dog bite 2 days ago.  EXAM: RIGHT FOOT COMPLETE - 3+ VIEW  COMPARISON:  08/05/2014  FINDINGS: There are mild soft tissue irregularities in the location of the previously observed laceration. There is no soft tissue gas. There is no bony abnormality. There is no radiopaque foreign body.  IMPRESSION: Negative for fracture, dislocation, radiopaque foreign body or soft tissue gas.   Electronically Signed   By: Andreas Newport M.D.   On: 08/10/2014 21:35    HISTORICAL MICRO/IMAGING  Assessment/Plan:  44 yo with cellulitis following dog bite.  She has a history of RA and is on Enbrel which makes her more susceptible to infection both routine bacterial as well as non bacterial.  The foot appears mildly infected on exam but is quite tender.  No leukocytosis or fever.  Currently on unasyn (was on augmentin at home)  Recommendations Continue unasyn If worsens add  vancomycin Culture purulence if any develops Elevate leg Check ESR crp IF worsens may need MRI or surgery

## 2014-08-11 NOTE — Progress Notes (Addendum)
Unasyn Dosing (Day 2)  Pharmacy consulted to dose unasn in this 43yo F being treated for cellulitis after multiple dog bites.   Allergies: sulfa  Est CrCl; 158ml/min  Unasyn 3gm IV Q6hrs ordered which is appropriate for renal function.   Pharmacy will continue to follow.

## 2014-08-11 NOTE — H&P (Addendum)
[facility] History and Physical  Marcia Pugh FVC:[id number] DOB: Feb 04, 1971 DOA: 08/10/2014  Referring physician: Dr. York Cerise PCP: No primary care provider on file. [facility]  Chief Complaint: Redness swelling and pain right foot  HPI: Marcia Pugh is a 44 y.o. female  Presents to the emergency room with the complaints of worsening of the redness pain and swelling of the right foot following the dog bite injuries she sustained on 08/05/2014. Patient mentioned that she was bit her  neighbor's dog on 26th of April 2016 for which she came to the emergency room was was evaluated by the ED physician and received sutures for the wounds on the right foot and was sent home on oral antibiotics. She has been using oral antibiotics since then but continued to have that increasing redness and swelling and the pain right foot for which she came to the emergency room tonight and was evaluated by the ED physician and was found to have significant cellulitis with wound infection of the right foot. Patient denies any fever she does have some mild nausea but denies any vomiting diarrhea or abdominal pain. Denies any chest pain shortness of breath. No cough. She underwent evaluation and lab work showed normal white blood cell count and patient was given IV clindamycin the ED physician and the podiatry on call was consulted by the ED physician and advised admission for further management. She received IV pain medications and currently her right foot pain is under control.  Past Medical History  Diagnosis Date  . Arthritis   . Hypertension   . Anxiety   . Insomnia   . GERD (gastroesophageal reflux disease)   . Depression   . Hypothyroidism     Past Surgical History  Procedure Laterality Date  . Cholecystectomy    . Tubal ligation      Family History  Problem Relation Age of Onset  . Lymphoma Mother   . Diabetes type II Other     Social History  reports that she has never smoked. She  does not have any smokeless tobacco history on file. She reports that she does not drink alcohol or use illicit drugs.  Allergies  Allergen Reactions  . Sulfa Antibiotics Rash    Prior to Admission medications   Medication Sig Start Date End Date Taking? Authorizing Provider  levothyroxine (SYNTHROID, LEVOTHROID) 112 MCG tablet Take 112 mcg by mouth daily before breakfast.    Yes Historical Provider, MD  lisinopril (PRINIVIL,ZESTRIL) 10 MG tablet Take 10 mg by mouth daily.    Yes Historical Provider, MD  omeprazole (PRILOSEC) 20 MG capsule Take 20 mg by mouth daily.    Yes Historical Provider, MD  traZODone (DESYREL) 100 MG tablet Take 100 mg by mouth at bedtime.    Yes Historical Provider, MD  venlafaxine (EFFEXOR) 50 MG tablet Take 50 mg by mouth 2 (two) times daily with a meal.    Yes Historical Provider, MD  Etanercept 25 MG/0.5ML SOSY Inject into the skin.    Historical Provider, MD  oxyCODONE-acetaminophen (PERCOCET/ROXICET) 5-325 MG per tablet Take by mouth.    Historical Provider, MD     Review of Systems:  Constitutional: No fevers, chills, fatigue, weakness, weight loss, weight gain.  Eyes: No blurred or double vision, pain, redness, inflammation. ENT: No tinnitus, ear pain, hearing loss, epistaxis, discharge, redness of oropharynx, difficulty swallowing. Resp: No cough, wheeze, hemoptysis, dyspnea, painful respiration. Cardio-vascular: No chest pain, orthopnea, edema, DOE, palpitations, syncope. GI: Nausea +,  No  vomiting, diarrhea, abdominal pain, hematemesis, melena, GERD, rectal bleeding, constipation. GU: No dysuria, hematuria, urgency, frequency, incontinence Endocrine: No polyuria, nocturia, heat or cold intolerance, thirst Hematologic/Lymphatic: No anemia, easy bruising bleeding, swollen glands Integumentary: Multiple wounds over rt foot dorsolaterally with surrounding erythema and edema with sutures in situ. Se Musculoskeletal: No pain in: neck-back-shoulder-knee-hip,  arthritis, gout, redness. Neuro: No numbness, weakness, dysarthria, epilepsy, tremor, vertigo, ataxia, dementia, headache, migraine, CVA, TIA, seizure, memory loss Psych: No anxiety, insomnia, ADD, OCD, bipolar, depression, schizophrenia.   Physical Exam: Constitutional: Filed Vitals:   08/10/14 1912  BP: 179/98  Pulse: 71  Temp: 98.2 F (36.8 C)  TempSrc: Oral  Resp: 20  Height: 5\' 8"  (1.727 m)  Weight: 79.379 kg (175 lb)  SpO2: 96%   Wt Readings from Last 3 Encounters:  08/10/14 79.379 kg (175 lb)   General:  Well developed, well nourished,  in no apparent distress HEENT: PERRL, EOMI, no scleral icterus, no conjunctivitis, no difficulty hearing, no pharyngeal erythema, Mucosa - moist, Neck: Thyroid no nodule, supple, no masses, non tender, no adenopathy, no JVD, no Carotid bruit, FROM Cardiovascular: RRR, no m/r/g, no S3/S4, chest wall nontender, good pedal pulses, good femoral pulses, rt foot mild local LE edema. Respiratory: CTA bilaterally, no wheeze, no rales, no ronchi, breath sounds not diminished, breathing not labored, no increased respiratory effort. Abdomen: soft, nontender, nondistended, no mass, bowel sounds present and normoactive, no hepatosplenomegaly. Musculoskeletal: 5/5 muscular strength x4 extremities, no cyanosis/clubbing, no DJD, no kyphosis, gait steady. Skin: Multiple superficial wounds over rt foot antero-laterally with surrounding erythema and edema with sutures in situ and sero-sanguinous discharge +. Lymphatic: No adenopathy (cervical/axilla/inguinal/supraclavicular) Neurologic: Cranial nerves intact, DTR intact, Sensation intact, Babinski sign R/L, no dysarthria, no aphasia, no dysphagia, no contractures Psychiatric: Alert, Oriented (time, person, place, circumstance), Cooperative, Judgement good, Memory intact, not confused, not agitated, not depressed, cooperative         Labs on Admission:  Basic Metabolic Panel:  Recent Labs Lab 08/10/14 1838   NA 138  K 3.4*  CL 106  CO2 25  GLUCOSE 94  BUN 16  CREATININE 0.73  CALCIUM 8.3*   Liver Function Tests:  Recent Labs Lab 08/10/14 1838  AST 40  ALT 34  ALKPHOS 54  BILITOT 0.6  PROT 7.8  ALBUMIN 3.7   No results for input(s): LIPASE, AMYLASE in the last 168 hours. No results for input(s): AMMONIA in the last 168 hours. CBC:  Recent Labs Lab 08/10/14 1838  WBC 4.5  NEUTROABS 2.7  HGB 8.6*  HCT 28.0*  MCV 73.1*  PLT 285   Cardiac Enzymes: No results for input(s): CKTOTAL, CKMB, CKMBINDEX, TROPONINI in the last 168 hours. BNP (last 3 results) No results for input(s): BNP in the last 8760 hours.  ProBNP (last 3 results) No results for input(s): PROBNP in the last 8760 hours.  CBG: No results for input(s): GLUCAP in the last 168 hours.  Radiological Exams on Admission: Dg Foot Complete Right  08/10/2014   CLINICAL DATA:  Dog bite 2 days ago.  EXAM: RIGHT FOOT COMPLETE - 3+ VIEW  COMPARISON:  08/05/2014  FINDINGS: There are mild soft tissue irregularities in the location of the previously observed laceration. There is no soft tissue gas. There is no bony abnormality. There is no radiopaque foreign body.  IMPRESSION: Negative for fracture, dislocation, radiopaque foreign body or soft tissue gas.   Electronically Signed   By: 08/07/2014 M.D.   On: 08/10/2014 21:35  EKG: Independently reviewed. Not done  Assessment/Plan Principal Problem:   Cellulitis of foot, right Active Problems:   HTN (hypertension)   Anemia   GERD (gastroesophageal reflux disease)   Depression   Arthritis   Hypothyroidism   1. Admit to medical floor, IV antibiotics Unasyn, follow-up labs. Consult podiatry and ID. #2 hypertension stable clinically, continue home medications. #3 anemia, acute versus chronic. Patient stable clinically. Monitor follow-up hemoglobin and hematocrit check iron studies follow-up accordingly. #4 GERD, stable continue PPI. #5 anxiety  disorder/depression stable on home medications continue same. #6 history history of arthritis stable on home medications continue same. #7 history of hypothyroidism stable on home medications continue same.  Code Status: Full code. DVT Prophylaxis: SubQ lovenox.  Time spent on admission: 50 minutes.  Betti Cruz, [facility] [place] [facility]

## 2014-08-12 DIAGNOSIS — L03115 Cellulitis of right lower limb: Secondary | ICD-10-CM | POA: Diagnosis not present

## 2014-08-12 MED ORDER — POLYETHYLENE GLYCOL 3350 17 G PO PACK
17.0000 g | PACK | Freq: Every day | ORAL | Status: DC
  Administered 2014-08-12 – 2014-08-16 (×5): 17 g via ORAL
  Filled 2014-08-12 (×5): qty 1

## 2014-08-12 MED ORDER — RABIES VACCINE, PCEC IM SUSR
1.0000 mL | Freq: Once | INTRAMUSCULAR | Status: AC
  Administered 2014-08-12: 1 mL via INTRAMUSCULAR
  Filled 2014-08-12: qty 1

## 2014-08-12 MED ORDER — RABIES VACCINE, PCEC IM SUSR: 1.0000 mL | Freq: Once | INTRAMUSCULAR | Status: DC

## 2014-08-12 NOTE — Progress Notes (Signed)
Entered in error

## 2014-08-12 NOTE — Progress Notes (Signed)
°  Date of Admission:  08/10/2014      ID: Marcia Pugh is a 44 y.o. female with RA on immunosuppressants with cellulits from dog bite   Principal Problem:   Cellulitis of foot, right Active Problems:   HTN (hypertension)   Anemia   GERD (gastroesophageal reflux disease)   Depression   Arthritis   Hypothyroidism   Subjective: Still with foot pain, swelling. No fevers  Medications:   ampicillin-sulbactam (UNASYN) IV  3 g Intravenous Q6H   aspirin EC  81 mg Oral Daily   enoxaparin (LOVENOX) injection  40 mg Subcutaneous Q24H   ferrous sulfate  325 mg Oral BID WC   levothyroxine  112 mcg Oral QAC breakfast   lisinopril  10 mg Oral Daily   pantoprazole  40 mg Oral Daily   polyethylene glycol  17 g Oral Daily   [START ON 08/19/2014] rabies vaccine  1 mL Intramuscular Once   traZODone  100 mg Oral QHS   venlafaxine  50 mg Oral BID WC    Objective: Vital signs in last 24 hours: Temp:  [97.9 F (36.6 C)-98 F (36.7 C)] 98 F (36.7 C) (05/03 1506) Pulse Rate:  [61-63] 62 (05/03 1506) Resp:  [18] 18 (05/03 1506) BP: (106-148)/(54-100) 120/68 mmHg (05/03 1506) SpO2:  [97 %-100 %] 100 % (05/03 1506) Weight:  [84.687 kg (186 lb 11.2 oz)] 84.687 kg (186 lb 11.2 oz) (05/03 0500)   General appearance: alert and cooperative Head: Normocephalic, without obvious abnormality, atraumatic Neck: no adenopathy, no carotid bruit, no JVD, supple, symmetrical, trachea midline and thyroid not enlarged, symmetric, no tenderness/mass/nodules Back: symmetric, no curvature. ROM normal. No CVA tenderness. Resp: clear to auscultation bilaterally Chest wall: no tenderness Cardio: regular rate and rhythm, S1, S2 normal, no murmur, click, rub or gallop GI: soft, non-tender; bowel sounds normal; no masses,  no organomegaly Extremities: extremities normal, atraumatic, no cyanosis or edema Skin: Skin color, texture, turgor normal. No rashes or lesions or several sutures inpalce with scab.  mod redness  Lab Results  Recent Labs  08/10/14 1838 08/11/14 0413  WBC 4.5 4.1  HGB 8.6* 8.0*  HCT 28.0* 25.4*  NA 138 137  K 3.4* 3.4*  CL 106 106  CO2 25 26  BUN 16 17  CREATININE 0.73 0.72   Liver Panel  Recent Labs  08/10/14 1838 08/11/14 0413  PROT 7.8 6.7  ALBUMIN 3.7 3.1*  AST 40 31  ALT 34 30  ALKPHOS 54 61  BILITOT 0.6 0.4   Sedimentation Rate No results for input(s): ESRSEDRATE in the last 72 hours. C-Reactive Protein No results for input(s): CRP in the last 72 hours.  Microbiology:  Studies/Results: Dg Foot Complete Right  08/10/2014   CLINICAL DATA:  Dog bite 2 days ago.  EXAM: RIGHT FOOT COMPLETE - 3+ VIEW  COMPARISON:  08/05/2014  FINDINGS: There are mild soft tissue irregularities in the location of the previously observed laceration. There is no soft tissue gas. There is no bony abnormality. There is no radiopaque foreign body.  IMPRESSION: Negative for fracture, dislocation, radiopaque foreign body or soft tissue gas.   Electronically Signed   By: Ellery Plunk M.D.   On: 08/10/2014 21:35     Assessment/Plan: Cellulitis from dog bite- some improvement with IV abx and elevation CRP pending. No fevers, wbc stable If worsens MRI  Mick Sell   08/12/2014, 3:28 PM

## 2014-08-12 NOTE — Progress Notes (Signed)
Patient rested well throughout shift.  Requested shower but was advised to not shower due to open wound on foot.  Patient washed up with basin.  Rabies shot given today in right upper arm.  Dr. Sampson Goon stated to leave dressing off foot and discontinue orders for dressing changes.  Patient family present at bedside throughout shift.

## 2014-08-12 NOTE — Progress Notes (Signed)
Patient ID: Marcia Pugh, female   DOB: [birth year], 44 y.o.   MRN: [id number] [facility] PROGRESS NOTE  DOA: 08/10/2014 PCP: No primary care provider on file.  HPI/Subjective: The patient states that she has worsening pain since they put the dressing on her foot. She states that she's been hobbling back to the bathroom and back to the bed. Very little drainage from the wound.  Objective: Filed Vitals:   08/12/14 0723  BP: 106/71  Pulse: 61  Temp: 97.9 F (36.6 C)  Resp: 18    Intake/Output Summary (Last 24 hours) at 08/12/14 1413 Last data filed at 08/12/14 1309  Gross per 24 hour  Intake    900 ml  Output      0 ml  Net    900 ml   Filed Weights   08/10/14 1912 08/11/14 0100 08/12/14 0500  Weight: 79.379 kg (175 lb) 83.462 kg (184 lb) 84.687 kg (186 lb 11.2 oz)    ROS: Review of Systems  Constitutional: Negative for fever and chills.  Eyes: Negative for blurred vision.  Respiratory: Negative for cough and shortness of breath.   Cardiovascular: Negative for chest pain.  Gastrointestinal: Positive for nausea. Negative for vomiting, diarrhea and constipation.  Genitourinary: Negative for dysuria.  Musculoskeletal: Positive for joint pain.  Neurological: Negative for dizziness and headaches.   Exam: Physical Exam  HENT:  Nose: No mucosal edema.  Mouth/Throat: No oropharyngeal exudate or posterior oropharyngeal edema.  Eyes: Conjunctivae, EOM and lids are normal. Pupils are equal, round, and reactive to light.  Neck: No JVD present. Carotid bruit is not present. No edema present. No thyroid mass and no thyromegaly present.  Cardiovascular: S1 normal and S2 normal.  Exam reveals no gallop.   No murmur heard. Pulses:      Dorsalis pedis pulses are 2+ on the right side, and 2+ on the left side.  Respiratory: No respiratory distress. She has no wheezes. She has no rhonchi. She has no rales.  GI: Soft. Bowel sounds are normal. There is no tenderness.   Musculoskeletal:       Right ankle: She exhibits decreased range of motion, swelling and laceration.  Lymphadenopathy:    She has no cervical adenopathy.  Neurological: She is alert. No cranial nerve deficit.  Skin: Skin is warm. Laceration noted. There is erythema. Nails show no clubbing.  Psychiatric: She has a normal mood and affect.   skin exam continued: The patient has numerous lacerations which were sutured. Drainage from one of the lacerations with opening of that laceration. Erythema on the top of foot is less than yesterday but still present.   Scheduled Meds: . ampicillin-sulbactam (UNASYN) IV  3 g Intravenous Q6H  . aspirin EC  81 mg Oral Daily  . enoxaparin (LOVENOX) injection  40 mg Subcutaneous Q24H  . ferrous sulfate  325 mg Oral BID WC  . levothyroxine  112 mcg Oral QAC breakfast  . lisinopril  10 mg Oral Daily  . pantoprazole  40 mg Oral Daily  . polyethylene glycol  17 g Oral Daily  . rabies vaccine  1 mL Intramuscular Once  . [START ON 08/19/2014] rabies vaccine  1 mL Intramuscular Once  . traZODone  100 mg Oral QHS  . venlafaxine  50 mg Oral BID WC   Continuous Infusions:   Assessment/Plan: Principal Problem:   Cellulitis of foot, right Active Problems:   HTN (hypertension)   Anemia   GERD (gastroesophageal reflux disease)   Depression  Arthritis   Hypothyroidism   1. Dog bite cellulitis of the right foot. IV unasyn. Apreciate podiatry consultation. Patient has poor range of motion of the right ankle and pain on minimal palpation. 2. Essential hypertension-stable on current medications. 3. Anemia likely iron deficiency - ferrous sulfate. The patient does have heavy menses and no other signs of bleeding. 4. Gastroesophageal reflux disease without esophagitis-patient takes Protonix at home will continue 5.   hypothyroidism unspecified continue levothyroxine. 6.   Hypokalemia- replaced  Code Status:     Code Status Orders        Start      Ordered   08/11/14 0050  Full code   Continuous     08/11/14 0049     Family Communication: Family at bedside Disposition Plan: Home  Consultants:  Podiatry and infectious disease  Time spent: 25 minutes  Alford Highland  [facility] [place] [facility]

## 2014-08-12 NOTE — Progress Notes (Signed)
ANTIBIOTIC CONSULT NOTE - FOLLOW UP  Pharmacy Consult for Unasyn Indication: Dog bite  Allergies  Allergen Reactions  . Sulfa Antibiotics Rash    Patient Measurements: Height: 5\' 8"  (172.7 cm) Weight: 186 lb 11.2 oz (84.687 kg) IBW/kg (Calculated) : 63.9kg Adjusted Body Weight: 72.2kg  Vital Signs:  Temp: 97.9 F (36.6 C) (05/03 0723) Temp Source: Oral (05/03 0723) BP: 106/71 mmHg (05/03 0723) Pulse Rate: 61 (05/03 0723) Intake/Output from previous day: 05/02 0701 - 05/03 0700 In: 780 [P.O.:480; IV Piggyback:300] Out: -  Intake/Output from this shift:    Labs:  Recent Labs  08/10/14 1838 08/11/14 0413  WBC 4.5 4.1  HGB 8.6* 8.0*  PLT 285 240  CREATININE 0.73 0.72   Estimated Creatinine Clearance: 102.3 mL/min (by C-G formula based on Cr of 0.72). No results for input(s): VANCOTROUGH, VANCOPEAK, VANCORANDOM, GENTTROUGH, GENTPEAK, GENTRANDOM, TOBRATROUGH, TOBRAPEAK, TOBRARND, AMIKACINPEAK, AMIKACINTROU, AMIKACIN in the last 72 hours.   Microbiology: No results found for this or any previous visit (from the past 720 hour(s)).  Anti-infectives    Start     Dose/Rate Route Frequency Ordered Stop   08/11/14 0215  Ampicillin-Sulbactam (UNASYN) 3 g in sodium chloride 0.9 % 100 mL IVPB     3 g 100 mL/hr over 60 Minutes Intravenous Every 6 hours 08/11/14 0213     08/10/14 2045  clindamycin (CLEOCIN) IVPB 600 mg     600 mg 100 mL/hr over 30 Minutes Intravenous  Once 08/10/14 2042 08/10/14 2224      Assessment: Patient remains afebrile and and has a normalized WBC. Will continue patient on current regimen of Ampicillin-Sulbactam 3g IV Q6hr. Recommend transition to Amoxicillin-clavulanate 875/125mg  PO BID when clinically appropriate.    Pharmacy will continue to monitor and adjust per consult.    Goal of Therapy:  Completion of antibiotic course   Plan:  Expected duration 10 days with resolution of temperature and/or normalization of WBC  Simpson,Michael  L 08/12/2014,9:20 AM

## 2014-08-12 NOTE — Progress Notes (Signed)
Pt. Alert and oriented. Vss. Up to bathroom with stand-by assist. Pain controlled with PO pain meds. Pt. Removed dressing stating her foot has been hurting her a great deal since bandage has been applied. Wet to dry dressing reapplied. Pt. Receiving IV antibiotics. Resting quietly. Will continue to monitor.

## 2014-08-13 MED ORDER — LACTULOSE 10 GM/15ML PO SOLN
30.0000 g | Freq: Two times a day (BID) | ORAL | Status: DC | PRN
  Administered 2014-08-13 – 2014-08-14 (×2): 30 g via ORAL
  Filled 2014-08-13 (×3): qty 60

## 2014-08-13 NOTE — Progress Notes (Signed)
Patient ID: Marcia Pugh, female   DOB: 1971-04-01, 44 y.o.   MRN: [id number] [facility] PROGRESS NOTE  DOA: 08/10/2014 PCP: No primary care provider on file.  HPI/Subjective: Patient still has a good amount of pain in the right lower extremity. Still has decreased range of motion at the ankle. She convinced one of the physicians not to put on a dressing, since the dressing from yesterday had made things worse with regards to her pain.   Objective: Filed Vitals:   08/13/14 0817  BP: 126/76  Pulse: 58  Temp: 98.2 F (36.8 C)  Resp: 18    Intake/Output Summary (Last 24 hours) at 08/13/14 1134 Last data filed at 08/13/14 0851  Gross per 24 hour  Intake   1240 ml  Output      0 ml  Net   1240 ml   Filed Weights   08/11/14 0100 08/12/14 0500 08/13/14 0500  Weight: 83.462 kg (184 lb) 84.687 kg (186 lb 11.2 oz) 82.192 kg (181 lb 3.2 oz)    ROS: Review of Systems  Constitutional: Negative for fever and chills.  Eyes: Negative for blurred vision.  Respiratory: Negative for cough and shortness of breath.   Cardiovascular: Negative for chest pain.  Gastrointestinal: Positive for constipation. Negative for nausea, vomiting and diarrhea.  Genitourinary: Negative for dysuria.  Musculoskeletal: Negative for joint pain.  Neurological: Negative for dizziness and headaches.   Exam: Physical Exam  HENT:  Nose: No mucosal edema.  Mouth/Throat: No oropharyngeal exudate or posterior oropharyngeal edema.  Eyes: Conjunctivae, EOM and lids are normal. Pupils are equal, round, and reactive to light.  Neck: No JVD present. Carotid bruit is not present. No edema present. No thyroid mass and no thyromegaly present.  Cardiovascular: S1 normal and S2 normal.  Exam reveals no gallop.   No murmur heard. Pulses:      Dorsalis pedis pulses are 2+ on the right side, and 2+ on the left side.  Respiratory: No respiratory distress. She has no wheezes. She has no rhonchi. She has no  rales.  GI: Soft. Bowel sounds are normal. There is no tenderness.  Musculoskeletal:       Right ankle: She exhibits decreased range of motion, swelling and laceration.  Lymphadenopathy:    She has no cervical adenopathy.  Neurological: She is alert. No cranial nerve deficit.  Skin: Skin is warm. Laceration noted. There is erythema. Nails show no clubbing.  Psychiatric: She has a normal mood and affect.   skin exam continued: The patient has numerous lacerations which were sutured. Erythema is starting to fade.   Scheduled Meds: . ampicillin-sulbactam (UNASYN) IV  3 g Intravenous Q6H  . aspirin EC  81 mg Oral Daily  . enoxaparin (LOVENOX) injection  40 mg Subcutaneous Q24H  . ferrous sulfate  325 mg Oral BID WC  . levothyroxine  112 mcg Oral QAC breakfast  . lisinopril  10 mg Oral Daily  . pantoprazole  40 mg Oral Daily  . polyethylene glycol  17 g Oral Daily  . [START ON 08/19/2014] rabies vaccine  1 mL Intramuscular Once  . traZODone  100 mg Oral QHS  . venlafaxine  50 mg Oral BID WC   Continuous Infusions:   Assessment/Plan: Principal Problem:   Cellulitis of foot, right Active Problems:   HTN (hypertension)   Anemia   GERD (gastroesophageal reflux disease)   Depression   Arthritis   Hypothyroidism   1. Dog bite cellulitis of the right foot. -  IV unasyn. - Appreciate podiatry and infectious disease consultations. - Follow-up on a day-to-day basis on potential discharge. Patient will still benefit from IV antibiotics today. 2. Essential hypertension -stable on current medications. 3. Anemia likely iron deficiency  -ferrous sulfate. The patient does have heavy menses and no other signs of bleeding. 4. Gastroesophageal reflux disease without esophagitis -patient takes Protonix at home will continue 5.   hypothyroidism unspecified continue levothyroxine. 6.   Hypokalemia- replaced  Code Status:     Code Status Orders        Start     Ordered   08/11/14 0050   Full code   Continuous     08/11/14 0049     Disposition Plan: Home  Consultants:  Podiatry and infectious disease  Time spent: 25 minutes  Alford Highland  [facility] [place] [facility]

## 2014-08-13 NOTE — Progress Notes (Signed)
Pt. Alert and oriented. VSS. Pain controlled with PO pain meds. Pt. Receiving IV antibiotics. Incision clean and dry with no dressing. Up to bathroom independently. Pills whole with water. Resting quietly. Will continue to monitor.

## 2014-08-13 NOTE — Progress Notes (Signed)
Pt resting in bed no complints of pain, continue on Iv antibiotics, no signs or symptoms of any adverse reaction. Wounds dry and intact. Family at bedside.  Vss. Will continue to monitor.

## 2014-08-13 NOTE — Outcomes Assessment (Signed)
pt had un-eventfull day pain controlled.right foot swollen slight color is slightly pink. possible discharge tomorrow

## 2014-08-13 NOTE — Progress Notes (Signed)
Pt doing well. Dog bite incisions look to be healing quite well. No active drainage.  Minimal erythema surrounding laceration sites.  Scabs forming to lacerations as well. Keep clean and dry. Recommended gentle ROM to right ankle. Can f/u with me in 10-14 days outpt.

## 2014-08-13 NOTE — Psychosocial Assessment (Addendum)
Chaplain met with patient and caregiver, offered prayer Chaplain Allyne Gee. Marcia Pugh  5:30pm [phone]

## 2014-08-13 NOTE — Progress Notes (Signed)
Date of Admission:  08/10/2014      ID: Marcia Pugh is a 44 y.o. female with RA on immunosuppressants with cellulits from dog bite   Principal Problem:   Cellulitis of foot, right Active Problems:   HTN (hypertension)   Anemia   GERD (gastroesophageal reflux disease)   Depression   Arthritis   Hypothyroidism   Subjective: Still with foot pain, swelling. No fevers  Medications:  . ampicillin-sulbactam (UNASYN) IV  3 g Intravenous Q6H  . aspirin EC  81 mg Oral Daily  . enoxaparin (LOVENOX) injection  40 mg Subcutaneous Q24H  . ferrous sulfate  325 mg Oral BID WC  . levothyroxine  112 mcg Oral QAC breakfast  . lisinopril  10 mg Oral Daily  . pantoprazole  40 mg Oral Daily  . polyethylene glycol  17 g Oral Daily  . [START ON 08/19/2014] rabies vaccine  1 mL Intramuscular Once  . traZODone  100 mg Oral QHS  . venlafaxine  50 mg Oral BID WC    Objective: Vital signs in last 24 hours: Temp:  [98 F (36.7 C)-98.5 F (36.9 C)] 98.2 F (36.8 C) (05/04 0817) Pulse Rate:  [58-64] 58 (05/04 0817) Resp:  [18] 18 (05/04 0817) BP: (111-136)/(54-82) 126/76 mmHg (05/04 0817) SpO2:  [95 %-100 %] 97 % (05/04 0817) Weight:  [82.192 kg (181 lb 3.2 oz)] 82.192 kg (181 lb 3.2 oz) (05/04 0500)   General appearance: alert and cooperative Head: Normocephalic, without obvious abnormality, atraumatic Neck: no adenopathy, no carotid bruit, no JVD, supple, symmetrical, trachea midline and thyroid not enlarged, symmetric, no tenderness/mass/nodules Back: symmetric, no curvature. ROM normal. No CVA tenderness. Resp: clear to auscultation bilaterally Chest wall: no tenderness Cardio: regular rate and rhythm, S1, S2 normal, no murmur, click, rub or gallop GI: soft, non-tender; bowel sounds normal; no masses,  no organomegaly Extremities: extremities normal, atraumatic, no cyanosis or edema Skin: Skin color, texture, turgor normal. No rashes or lesions or several sutures inpalce with scab. mod  redness  Lab Results  Recent Labs  08/10/14 1838 08/11/14 0413  WBC 4.5 4.1  HGB 8.6* 8.0*  HCT 28.0* 25.4*  NA 138 137  K 3.4* 3.4*  CL 106 106  CO2 25 26  BUN 16 17  CREATININE 0.73 0.72   Liver Panel  Recent Labs  08/10/14 1838 08/11/14 0413  PROT 7.8 6.7  ALBUMIN 3.7 3.1*  AST 40 31  ALT 34 30  ALKPHOS 54 61  BILITOT 0.6 0.4   C-reactive protein  Microbiology: Results for orders placed or performed in visit on 07/31/12  Wet prep, genital     Status: None   Collection Time: 07/31/12  7:21 PM  Result Value Ref Range Status   Micro Text Report   Final       COMMENT                   MODERATE WHITE BLOOD CELLS SEEN   COMMENT                   CLUE CELLS SEEN   COMMENT                   YEAST SEEN   COMMENT                   NO TRICHOMONAS SEEN   COMMENT  NO SPERMATOZOA SEEN   ANTIBIOTIC                                                         Assessment/Plan: Cellulitis from dog bite- some improvement with IV abx and elevation CRP pending. No fevers, wbc stable If worsens MRI  Mick Sell   08/13/2014, 1:40 PM

## 2014-08-13 NOTE — Progress Notes (Signed)
Nursing assistant attempted several times to get pts. Weight and pts. Significant other was in the bed with her every time attempt was made. Will attempt again and will pass on to oncoming shift.

## 2014-08-14 ENCOUNTER — Inpatient Hospital Stay: Payer: Medicare Other

## 2014-08-14 LAB — BASIC METABOLIC PANEL
ANION GAP: 6 (ref 5–15)
BUN: 13 mg/dL (ref 6–20)
CALCIUM: 8.4 mg/dL — AB (ref 8.9–10.3)
CHLORIDE: 101 mmol/L (ref 101–111)
CO2: 30 mmol/L (ref 22–32)
Creatinine, Ser: 0.73 mg/dL (ref 0.44–1.00)
GFR calc Af Amer: >60 mL/min (ref >60)
GFR calc non Af Amer: >60 mL/min (ref >60)
Glucose, Bld: 117 mg/dL — ABNORMAL HIGH (ref 65–99)
Potassium: 3.5 mmol/L (ref 3.5–5.1)
SODIUM: 137 mmol/L (ref 135–145)

## 2014-08-14 LAB — HEMOGLOBIN: HEMOGLOBIN: 7.9 g/dL — AB (ref 12.0–16.0)

## 2014-08-14 MED ORDER — GADOBENATE DIMEGLUMINE 529 MG/ML IV SOLN
20.0000 mL | Freq: Once | INTRAVENOUS | Status: AC | PRN
  Administered 2014-08-14: 17 mL via INTRAVENOUS

## 2014-08-14 NOTE — Progress Notes (Signed)
Patient rested well throughout the night. No complaints or SS of distress, will continue to monitor.

## 2014-08-14 NOTE — Progress Notes (Signed)
[facility] - [place] at [facility]   PATIENT NAME: Marcia Pugh    MR#:  [id number]  DATE OF BIRTH:  1970/08/13  SUBJECTIVE:  CHIEF COMPLAINT:   Chief Complaint  Patient presents with  . Foot Pain    Pt presents to ER alert and in NAD. Pt reports she is on antibiotics for dog bite to right foot. Pt states it has had increased swelling and drainage.  No improvement of right foot pain and swelling since yesterday though swelling has improved since admission. Patient unable to bear any weight on the right foot due to significant pain.  REVIEW OF SYSTEMS:  CONSTITUTIONAL: No fever, fatigue or weakness.  EYES: No blurred or double vision.  EARS, NOSE, AND THROAT: No tinnitus or ear pain.  RESPIRATORY: No cough, shortness of breath, wheezing or hemoptysis.  CARDIOVASCULAR: No chest pain, orthopnea, edema.  GASTROINTESTINAL: No nausea, vomiting, diarrhea or abdominal pain.  GENITOURINARY: No dysuria, hematuria.  HEMATOLOGY: No anemia, easy bruising or bleeding SKIN: No rash or lesion. MUSCULOSKELETAL: Significant right foot pain. No joint pain or arthritis.   NEUROLOGIC: No tingling, numbness, weakness.  PSYCHIATRY: No anxiety or depression.   DRUG ALLERGIES:   Allergies  Allergen Reactions  . Sulfa Antibiotics Rash    VITALS:  Blood pressure 118/73, pulse 59, temperature 98.1 F (36.7 C), temperature source Oral, resp. rate 18, height 5\' 8"  (1.727 m), weight 84.142 kg (185 lb 8 oz), last menstrual period 08/10/2014, SpO2 98 %.  PHYSICAL EXAMINATION:  GENERAL:  44 y.o.-year-old patient lying in the bed with no acute distress.  EYES: Pupils equal, round, reactive to light and accommodation. No scleral icterus. Extraocular muscles intact.  HEENT: Head atraumatic, normocephalic. Oropharynx and nasopharynx clear.  NECK:  Supple, no jugular venous distention. No thyroid enlargement, no tenderness.  LUNGS: Normal breath sounds bilaterally, no wheezing,  rales,rhonchi or crepitation. No use of accessory muscles of respiration.  CARDIOVASCULAR: S1, S2 normal. No murmurs, rubs, or gallops.  ABDOMEN: Soft, nontender, nondistended. Bowel sounds present. No organomegaly or mass.  EXTREMITIES: Right foot swelling and erythema extending on the dorsum, significant tenderness.  Sutures present over the lacerations- healing well. No pedal edema, cyanosis, or clubbing.  NEUROLOGIC: Cranial nerves II through XII are intact. Muscle strength 5/5 in all extremities. Sensation intact. Gait not checked.  PSYCHIATRIC: The patient is alert and oriented x 3.  SKIN: No obvious rash, lesion, or ulcer.    LABORATORY PANEL:   CBC  Recent Labs Lab 08/11/14 0413 08/14/14 0415  WBC 4.1  --   HGB 8.0* 7.9*  HCT 25.4*  --   PLT 240  --    ------------------------------------------------------------------------------------------------------------------  Chemistries   Recent Labs Lab 08/11/14 0413 08/14/14 0415  NA 137 137  K 3.4* 3.5  CL 106 101  CO2 26 30  GLUCOSE 114* 117*  BUN 17 13  CREATININE 0.72 0.73  CALCIUM 7.9* 8.4*  AST 31  --   ALT 30  --   ALKPHOS 61  --   BILITOT 0.4  --    ------------------------------------------------------------------------------------------------------------------  Cardiac Enzymes No results for input(s): TROPONINI in the last 168 hours. ------------------------------------------------------------------------------------------------------------------  RADIOLOGY:  No results found.  EKG:  No orders found for this or any previous visit.  ASSESSMENT AND PLAN:   Principal Problem:  Cellulitis of foot, right  Active Problems:  HTN (hypertension)  Anemia  GERD (gastroesophageal reflux disease)  Depression  Arthritis  Hypothyroidism   1. Dog bite and cellulitis  of the right foot - Appreciate podiatry consult, suturing of lacerations done- healing well- outpt f/u recommended - cont  IV  unasyn. - Appreciate infectious disease consultation. - Since no improvement in pain, swelling- weight bearing status- MRI of the foot and ankle being ordered.  2. Essential hypertension -stable on current medications. On lisinopril.  3. Anemia likely iron deficiency -ferrous sulfate. The patient does have heavy menses and no other signs of bleeding. Monitor, Transfuse if hb <7  4. Gastroesophageal reflux disease without esophagitis -patient takes Protonix at home will continue  5. hypothyroidism unspecified continue levothyroxine.  6. Rheumatoid arthritis- on Enbrel- currently on hold.     All the records are reviewed and case discussed with Care Management/Social Workerr. Management plans discussed with the patient, family and they are in agreement.  CODE STATUS: FULL CODE  TOTAL TIME TAKING CARE OF THIS PATIENT: 37 minutes.   POSSIBLE D/C IN 1-2  DAYS, DEPENDING ON CLINICAL CONDITION.   Sophia Cubero M.D on 08/14/2014 at 1:14 PM  Between 7am to 6pm - Pager - [phone]  After 6pm go to [email or web address] - password [email or web address] [facility]  [facility] [facility]  Office  [phone]  CC: Primary care physician; No primary care provider on file.

## 2014-08-15 ENCOUNTER — Inpatient Hospital Stay: Payer: Medicare Other

## 2014-08-15 LAB — CBC
HCT: 27.6 % — ABNORMAL LOW (ref 35.0–47.0)
Hemoglobin: 8.4 g/dL — ABNORMAL LOW (ref 12.0–16.0)
MCH: 22.6 pg — ABNORMAL LOW (ref 26.0–34.0)
MCHC: 30.3 g/dL — ABNORMAL LOW (ref 32.0–36.0)
MCV: 74.6 fL — AB (ref 80.0–100.0)
Platelets: 323 10*3/uL (ref 150–440)
RBC: 3.7 MIL/uL — ABNORMAL LOW (ref 3.80–5.20)
RDW: 17.7 % — AB (ref 11.5–14.5)
WBC: 4.3 10*3/uL (ref 3.6–11.0)

## 2014-08-15 LAB — BASIC METABOLIC PANEL
ANION GAP: 6 (ref 5–15)
BUN: 14 mg/dL (ref 6–20)
CO2: 31 mmol/L (ref 22–32)
CREATININE: 0.86 mg/dL (ref 0.44–1.00)
Calcium: 8.3 mg/dL — ABNORMAL LOW (ref 8.9–10.3)
Chloride: 102 mmol/L (ref 101–111)
GFR calc non Af Amer: >60 mL/min (ref >60)
Glucose, Bld: 87 mg/dL (ref 65–99)
POTASSIUM: 3.6 mmol/L (ref 3.5–5.1)
SODIUM: 139 mmol/L (ref 135–145)

## 2014-08-15 LAB — SEDIMENTATION RATE: Sed Rate: 34 mm/hr — ABNORMAL HIGH (ref 0–20)

## 2014-08-15 LAB — C-REACTIVE PROTEIN: CRP: <0.5 mg/dL (ref <1.0)

## 2014-08-15 MED ORDER — VANCOMYCIN HCL IN DEXTROSE 750-5 MG/150ML-% IV SOLN
750.0000 mg | INTRAVENOUS | Status: AC
  Administered 2014-08-15: 750 mg via INTRAVENOUS
  Filled 2014-08-15: qty 150

## 2014-08-15 MED ORDER — FLUCONAZOLE 50 MG PO TABS
150.0000 mg | ORAL_TABLET | Freq: Once | ORAL | Status: AC
  Administered 2014-08-15: 150 mg via ORAL
  Filled 2014-08-15: qty 1

## 2014-08-15 MED ORDER — PIPERACILLIN-TAZOBACTAM 3.375 G IVPB
3.3750 g | Freq: Three times a day (TID) | INTRAVENOUS | Status: DC
  Administered 2014-08-15 – 2014-08-16 (×4): 3.375 g via INTRAVENOUS
  Filled 2014-08-15 (×7): qty 50

## 2014-08-15 MED ORDER — VANCOMYCIN HCL IN DEXTROSE 750-5 MG/150ML-% IV SOLN
750.0000 mg | Freq: Three times a day (TID) | INTRAVENOUS | Status: DC
  Administered 2014-08-16 (×2): 750 mg via INTRAVENOUS
  Filled 2014-08-15 (×5): qty 150

## 2014-08-15 NOTE — Progress Notes (Addendum)
Date of Admission:  08/10/2014      ID: Marcia Pugh is a 44 y.o. female with RA on immunosuppressants with cellulits from dog bite   Principal Problem:   Cellulitis of foot, right Active Problems:   HTN (hypertension)   Anemia   GERD (gastroesophageal reflux disease)   Depression   Arthritis   Hypothyroidism   Subjective: Had MRI due to continued foot pain and swelling. Seen by Dr Ether Griffins. Some improvement Remains on IV unasyn   Medications:  . ampicillin-sulbactam (UNASYN) IV  3 g Intravenous Q6H  . aspirin EC  81 mg Oral Daily  . enoxaparin (LOVENOX) injection  40 mg Subcutaneous Q24H  . ferrous sulfate  325 mg Oral BID WC  . levothyroxine  112 mcg Oral QAC breakfast  . lisinopril  10 mg Oral Daily  . pantoprazole  40 mg Oral Daily  . polyethylene glycol  17 g Oral Daily  . [START ON 08/19/2014] rabies vaccine  1 mL Intramuscular Once  . traZODone  100 mg Oral QHS  . venlafaxine  50 mg Oral BID WC    Objective: Vital signs in last 24 hours: Temp:  [97.8 F (36.6 C)-98.2 F (36.8 C)] 97.9 F (36.6 C) (05/06 0417) Pulse Rate:  [57-64] 61 (05/06 0417) Resp:  [17-18] 17 (05/06 0417) BP: (119-144)/(76-86) 119/76 mmHg (05/06 0417) SpO2:  [98 %-100 %] 98 % (05/06 0417)   General appearance: alert and cooperative Head: Normocephalic, without obvious abnormality, atraumatic Neck: no adenopathy, no carotid bruit, no JVD, supple, symmetrical, trachea midline and thyroid not enlarged, symmetric, no tenderness/mass/nodules Back: symmetric, no curvature. ROM normal. No CVA tenderness. Resp: clear to auscultation bilaterally Chest wall: no tenderness Cardio: regular rate and rhythm, S1, S2 normal, no murmur, click, rub or gallop GI: soft, non-tender; bowel sounds normal; no masses,  no organomegaly Extremities: extremities normal, atraumatic, no cyanosis or edema Skin: Skin color, texture, turgor normal. No rashes or lesions or several sutures inpalce with scab. mod  redness  Lab Results  Recent Labs  08/14/14 0415 08/15/14 0415  WBC  --  4.3  HGB 7.9* 8.4*  HCT  --  27.6*  NA 137 139  K 3.5 3.6  CL 101 102  CO2 30 31  BUN 13 14  CREATININE 0.73 0.86   Microbiology: Results for orders placed or performed in visit on 07/31/12  Wet prep, genital     Status: None   Collection Time: 07/31/12  7:21 PM  Result Value Ref Range Status   Micro Text Report   Final       COMMENT                   MODERATE WHITE BLOOD CELLS SEEN   COMMENT                   CLUE CELLS SEEN   COMMENT                   YEAST SEEN   COMMENT                   NO TRICHOMONAS SEEN   COMMENT                   NO SPERMATOZOA SEEN   ANTIBIOTIC  Dg Ankle Complete Right  08/05/2014   CLINICAL DATA:  Status post dog bite today with multiple lacerations. Initial encounter.  EXAM: RIGHT ANKLE - COMPLETE 3+ VIEW  COMPARISON:  None.  FINDINGS: No radiopaque foreign body is identified. There is no fracture or dislocation. Lacerations are identified about the ankle.  IMPRESSION: Lacerations without underlying fracture or foreign body.   Electronically Signed   By: Drusilla Kanner M.D.   On: 08/05/2014 16:38   Mr Foot Right W Wo Contrast  08/15/2014   CLINICAL DATA:  Status post dog bite right foot 08/05/2014 with redness, pain and swelling. Draining wounds. Question abscess or osteomyelitis. History of rheumatoid arthritis and immunosuppressive therapy.  EXAM: MRI OF THE RIGHT FOREFOOT WITHOUT AND WITH CONTRAST  TECHNIQUE: Multiplanar, multisequence MR imaging was performed both before and after administration of intravenous contrast.  CONTRAST:  17 mL MULTIHANCE GADOBENATE DIMEGLUMINE 529 MG/ML IV SOLN  COMPARISON:  Plain films of the right foot 08/10/2014 and 08/05/2014.  FINDINGS: Skin wounds are seen just distal to the lateral malleolus, lateral, posterior calcaneus and along the lateral margin of the foot just superior to the  calcaneocuboid joint. Subcutaneous tissues about the wounds and over the dorsum of the foot demonstrate edema and enhancement. A small fluid collection measuring 1.5 cm long by up to 0.7 cm transverse by 0.6 cm craniocaudal extends posterior and cephalad to the most distal wound. A second thin fluid collection distal to this wound is seen measuring 1.3 cm long by 0.4 cm transverse by 0.9 cm craniocaudal is identified.  The lateral malleolus is edematous with postcontrast enhancement. Edema and enhancement are also seen in the medial malleolus extending cephalad into the metaphysis, 3.5 cm above the tip of the medial malleolus. There is a defect in the cortex of the medial malleolus at its articulation with the talus. Small cysts are also identified in the medial malleolus. Finally, there is edema and enhancement in the medial 1.3 cm of the navicular bone.  Tendons about the ankle are intact. No evidence of tenosynovitis is identified. Ligamentous structures about the ankle are also intact. The plantar fascia appears normal.  IMPRESSION: At least 3 skin wounds are identified consistent with history of dog bite. Changes consistent with cellulitis are seen about the foot with small fluid collections about the distal most wound consistent with abscesses.  Edema and enhancement in the lateral malleolus is most consistent with osteomyelitis given proximity to a skin wound. Edema and enhancement in the medial malleolus and medial margin of the navicular bone could be secondary to osteomyelitis or be related to the patient's history of rheumatoid arthritis.   Electronically Signed   By: Drusilla Kanner M.D.   On: 08/15/2014 07:59   Dg Foot Complete Right  08/10/2014   CLINICAL DATA:  Dog bite 2 days ago.  EXAM: RIGHT FOOT COMPLETE - 3+ VIEW  COMPARISON:  08/05/2014  FINDINGS: There are mild soft tissue irregularities in the location of the previously observed laceration. There is no soft tissue gas. There is no bony  abnormality. There is no radiopaque foreign body.  IMPRESSION: Negative for fracture, dislocation, radiopaque foreign body or soft tissue gas.   Electronically Signed   By: Ellery Plunk M.D.   On: 08/10/2014 21:35   Dg Foot Complete Right  08/05/2014   CLINICAL DATA:  Multiple lacerations to the right foot along the dorsal and lateral surfaces  EXAM: RIGHT FOOT COMPLETE - 3+ VIEW  COMPARISON:  None.  FINDINGS: There  is no evidence of fracture or dislocation. There is mild osteoarthritis of the first MTP joint and third PIP joint. Soft tissue laceration along the anterior lateral ankle.  IMPRESSION: No acute osseous injury of the right foot. Soft tissue laceration along the anterior lateral aspect of the ankle.   Electronically Signed   By: Elige Ko   On: 08/05/2014 19:50   IMPRESSION: At least 3 skin wounds are identified consistent with history of dog bite. Changes consistent with cellulitis are seen about the foot with small fluid collections about the distal most wound consistent with abscesses.  Edema and enhancement in the lateral malleolus is most consistent with osteomyelitis given proximity to a skin wound. Edema and enhancement in the medial malleolus and medial margin of the navicular bone could be secondary to osteomyelitis or be related to the patient's history of rheumatoid arthritis.  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  No fevers, wbc stable Discussed with Dr Ether Griffins and with patient that given continued infection, MRI findings and immunocompromised state that placement of PICC and 2 weeks IV abx would be best course.  I will change empirically from unasyn to zosyn since Q 6 hr dosing is very diff to do at home. Add vanco and she now reports hx of MRSA infection L leg several years ago. I have filled out abx order sheet and left in chart.  Ordered Picc. Will see in 2  weeks.    Artavius Stearns   08/15/2014, 1:28 PM

## 2014-08-15 NOTE — Progress Notes (Signed)
ANTIBIOTIC CONSULT NOTE - FOLLOW UP  Pharmacy Consult for Unasyn Indication: Dog bite  Allergies  Allergen Reactions  . Sulfa Antibiotics Rash    Patient Measurements: Height: 5\' 8"  (172.7 cm) Weight: 185 lb 8 oz (84.142 kg) IBW/kg (Calculated) : 63.9kg Adjusted Body Weight: 72.2kg  Vital Signs:  Temp: 97.9 F (36.6 C) (05/06 0417) Temp Source: Oral (05/06 0417) BP: 119/76 mmHg (05/06 0417) Pulse Rate: 61 (05/06 0417) Intake/Output from previous day: 05/05 0701 - 05/06 0700 In: 1940 [P.O.:840; IV Piggyback:1100] Out: -  Intake/Output from this shift:    Labs:  Recent Labs  08/14/14 0415 08/15/14 0415  WBC  --  4.3  HGB 7.9* 8.4*  PLT  --  323  CREATININE 0.73 0.86   Estimated Creatinine Clearance: 94.9 mL/min (by C-G formula based on Cr of 0.86). No results for input(s): VANCOTROUGH, VANCOPEAK, VANCORANDOM, GENTTROUGH, GENTPEAK, GENTRANDOM, TOBRATROUGH, TOBRAPEAK, TOBRARND, AMIKACINPEAK, AMIKACINTROU, AMIKACIN in the last 72 hours.   Microbiology: No results found for this or any previous visit (from the past 720 hour(s)).  Anti-infectives    Start     Dose/Rate Route Frequency Ordered Stop   08/15/14 0745  fluconazole (DIFLUCAN) tablet 150 mg     150 mg Oral  Once 08/15/14 0736 08/15/14 0905   08/11/14 0215  Ampicillin-Sulbactam (UNASYN) 3 g in sodium chloride 0.9 % 100 mL IVPB     3 g 100 mL/hr over 60 Minutes Intravenous Every 6 hours 08/11/14 0213     08/10/14 2045  clindamycin (CLEOCIN) IVPB 600 mg     600 mg 100 mL/hr over 30 Minutes Intravenous  Once 08/10/14 2042 08/10/14 2224      Assessment: Patient remains afebrile and and has a normalized WBC. Will continue patient on current regimen of Ampicillin-Sulbactam 3g IV Q6hr. Recommend transition to Amoxicillin-clavulanate 875/125mg  PO BID when clinically appropriate.    Pharmacy will continue to monitor and adjust per consult.    Goal of Therapy:  Completion of antibiotic course   Plan:   Expected duration 10 days with resolution of temperature and/or normalization of WBC  Haylyn Halberg K 08/15/2014,9:38 AM

## 2014-08-15 NOTE — Progress Notes (Signed)
[facility] - [place] at [facility]   PATIENT NAME: Marcia Pugh    MR#:  [id number]  DATE OF BIRTH:  1970/08/03  SUBJECTIVE:  CHIEF COMPLAINT:   Chief Complaint  Patient presents with  . Foot Pain    Pt presents to ER alert and in NAD. Pt reports she is on antibiotics for dog bite to right foot. Pt states it has had increased swelling and drainage.  No improvement of right foot pain and swelling. MRI done showing possible abscesses and lateral malleolus osteomyelitis.  REVIEW OF SYSTEMS:  Review of Systems  Constitutional: Negative for fever and chills.  Respiratory: Negative for cough and shortness of breath.   Cardiovascular: Negative for chest pain.  Gastrointestinal: Negative for nausea, vomiting and abdominal pain.  Musculoskeletal:       Right foot pain an weight bearing  Neurological: Negative for dizziness.    DRUG ALLERGIES:   Allergies  Allergen Reactions  . Sulfa Antibiotics Rash    VITALS:  Blood pressure 119/76, pulse 61, temperature 97.9 F (36.6 C), temperature source Oral, resp. rate 17, height 5\' 8"  (1.727 m), weight 84.142 kg (185 lb 8 oz), last menstrual period 08/10/2014, SpO2 98 %.  PHYSICAL EXAMINATION:  Physical Exam  GENERAL:  44 y.o.-year-old patient lying in the bed with no acute distress.  EYES: Pupils equal, round, reactive to light and accommodation. No scleral icterus. Extraocular muscles intact.  HEENT: Head atraumatic, normocephalic. Oropharynx and nasopharynx clear.  NECK:  Supple, no jugular venous distention. No thyroid enlargement, no tenderness.  LUNGS: Normal breath sounds bilaterally, no wheezing, rales,rhonchi or crepitation. No use of accessory muscles of respiration.  CARDIOVASCULAR: S1, S2 normal. No murmurs, rubs, or gallops.  ABDOMEN: Soft, nontender, nondistended. Bowel sounds present. No organomegaly or mass.  EXTREMITIES:No cyanosis, or clubbing. Right dorsum of foot erythematous and sutures on  the lacerations, less erythema but disproportionate tenderness NEUROLOGIC: Cranial nerves II through XII are intact. Muscle strength 5/5 in all extremities. Sensation intact. Gait not checked.  PSYCHIATRIC: The patient is alert and oriented x 3.  SKIN: No obvious rash, lesion, or ulcer.    LABORATORY PANEL:   CBC  Recent Labs Lab 08/15/14 0415  WBC 4.3  HGB 8.4*  HCT 27.6*  PLT 323   ------------------------------------------------------------------------------------------------------------------  Chemistries   Recent Labs Lab 08/11/14 0413  08/15/14 0415  NA 137  < > 139  K 3.4*  < > 3.6  CL 106  < > 102  CO2 26  < > 31  GLUCOSE 114*  < > 87  BUN 17  < > 14  CREATININE 0.72  < > 0.86  CALCIUM 7.9*  < > 8.3*  AST 31  --   --   ALT 30  --   --   ALKPHOS 61  --   --   BILITOT 0.4  --   --   < > = values in this interval not displayed. ------------------------------------------------------------------------------------------------------------------  Cardiac Enzymes No results for input(s): TROPONINI in the last 168 hours. ------------------------------------------------------------------------------------------------------------------  RADIOLOGY:  Mr Foot Right W Wo Contrast  08/15/2014   CLINICAL DATA:  Status post dog bite right foot 08/05/2014 with redness, pain and swelling. Draining wounds. Question abscess or osteomyelitis. History of rheumatoid arthritis and immunosuppressive therapy.  EXAM: MRI OF THE RIGHT FOREFOOT WITHOUT AND WITH CONTRAST  TECHNIQUE: Multiplanar, multisequence MR imaging was performed both before and after administration of intravenous contrast.  CONTRAST:  17 mL MULTIHANCE GADOBENATE DIMEGLUMINE 529  MG/ML IV SOLN  COMPARISON:  Plain films of the right foot 08/10/2014 and 08/05/2014.  FINDINGS: Skin wounds are seen just distal to the lateral malleolus, lateral, posterior calcaneus and along the lateral margin of the foot just superior to the  calcaneocuboid joint. Subcutaneous tissues about the wounds and over the dorsum of the foot demonstrate edema and enhancement. A small fluid collection measuring 1.5 cm long by up to 0.7 cm transverse by 0.6 cm craniocaudal extends posterior and cephalad to the most distal wound. A second thin fluid collection distal to this wound is seen measuring 1.3 cm long by 0.4 cm transverse by 0.9 cm craniocaudal is identified.  The lateral malleolus is edematous with postcontrast enhancement. Edema and enhancement are also seen in the medial malleolus extending cephalad into the metaphysis, 3.5 cm above the tip of the medial malleolus. There is a defect in the cortex of the medial malleolus at its articulation with the talus. Small cysts are also identified in the medial malleolus. Finally, there is edema and enhancement in the medial 1.3 cm of the navicular bone.  Tendons about the ankle are intact. No evidence of tenosynovitis is identified. Ligamentous structures about the ankle are also intact. The plantar fascia appears normal.  IMPRESSION: At least 3 skin wounds are identified consistent with history of dog bite. Changes consistent with cellulitis are seen about the foot with small fluid collections about the distal most wound consistent with abscesses.  Edema and enhancement in the lateral malleolus is most consistent with osteomyelitis given proximity to a skin wound. Edema and enhancement in the medial malleolus and medial margin of the navicular bone could be secondary to osteomyelitis or be related to the patient's history of rheumatoid arthritis.   Electronically Signed   By: Drusilla Kanner M.D.   On: 08/15/2014 07:59    EKG:  No orders found for this or any previous visit.  ASSESSMENT AND PLAN:   Principal Problem:  Cellulitis of foot, right  Active Problems:  HTN (hypertension)  Anemia  GERD (gastroesophageal reflux disease)  Depression  Arthritis  Hypothyroidism   1. Dog bite and  cellulitis of the right foot - Appreciate podiatry consult, suturing of lacerations done- healing well- cont IV unasyn. But significant tenderness- so MRI foot done showing possible osteomyelitis and also abscesses Podiatry reconsulted- will await their input to see if further debridement needs to be done. ID consulted as well- will wait until podiatry input to see if pt needs PICC and long term IV ABX - Appreciate infectious disease and Podiatry consultations.  2. Essential hypertension -stable on current medications. On lisinopril.  3. Anemia likely iron deficiency -ferrous sulfate. The patient does have heavy menses and no other signs of bleeding. Monitor, Transfuse if hb <7  4. Gastroesophageal reflux disease without esophagitis -patient takes Protonix at home will continue  5. hypothyroidism unspecified continue levothyroxine.  6. Rheumatoid arthritis- on Enbrel- currently on hold.     All the records are reviewed and case discussed with Care Management/Social Workerr. Management plans discussed with the patient, family and they are in agreement.  CODE STATUS: FULL CODE  TOTAL TIME TAKING CARE OF THIS PATIENT: 37 minutes.   POSSIBLE D/C IN 1-2 DAYS, DEPENDING ON CLINICAL CONDITION.   Enid Baas M.D on 08/15/2014 at 10:18 AM  Between 7am to 6pm - Pager - [phone]  After 6pm go to [email or web address] - password [email or web address] [facility]  [place] [employer] [facility]  Office  [phone]  CC: Primary care physician; No primary  care provider on file.

## 2014-08-15 NOTE — Progress Notes (Signed)
Daily Progress Note   Subjective  - * No surgery found *  F/u right foot dog bite.  Pt was c/o continued pain and MRI performed.  Corcerning for abscess and osteomyelitis and asked to re-evaluate.  Pt seen today and was able to ambulate upon my arrival.  Objective Filed Vitals:   08/14/14 1529 08/14/14 2012 08/15/14 0015 08/15/14 0417  BP: 144/79 132/79 133/86 119/76  Pulse: 59 64 57 61  Temp: 98.2 F (36.8 C) 98.1 F (36.7 C) 97.8 F (36.6 C) 97.9 F (36.6 C)  TempSrc: Oral Oral Oral Oral  Resp: 18 18 18 17   Height:      Weight:      SpO2: 100% 99% 98% 98%    Intake/Output Summary (Last 24 hours) at 08/15/14 1243 Last data filed at 08/15/14 0800  Gross per 24 hour  Intake   1820 ml  Output      0 ml  Net   1820 ml    PULM  CTAB CV  RRR VASC  Intact MS:  Lacerations continue to remain stable.  Laceration along distal fibula is healing very nicely without erythema or edema.  Demonstated much better ROM of ankle with dorsi/plantarflexion.  Foot lacerations over calc-cubuoid are also stable.  Mild scabbing is noted to all lacerations. Most distal laceration scab was opened and only scant bloody drainage was expressed.  Was quite tender with palpation along the distal most laceration sites. Between the proximal and distal lacerations there is exquisite tenderness with palpation. No bogginess. Erythema is mild. This is not consistent with severe abscess area. The lymphangitic streaking is noted. The cellulitis is markedly improved.   Laboratory CBC    Component Value Date/Time   WBC 4.3 08/15/2014 0415   WBC 4.8 08/26/2013 0408   HGB 8.4* 08/15/2014 0415   HGB 9.8* 08/26/2013 0408   HCT 27.6* 08/15/2014 0415   HCT 30.7* 08/26/2013 0408   PLT 323 08/15/2014 0415   PLT 169 08/26/2013 0408    BMET    Component Value Date/Time   NA 139 08/15/2014 0415   NA 138 08/26/2013 0408   K 3.6 08/15/2014 0415   K 4.3 08/26/2013 0408   CL 102 08/15/2014 0415   CL 107  08/26/2013 0408   CO2 31 08/15/2014 0415   CO2 29 08/26/2013 0408   GLUCOSE 87 08/15/2014 0415   GLUCOSE 107* 08/26/2013 0408   BUN 14 08/15/2014 0415   BUN 13 08/26/2013 0408   CREATININE 0.86 08/15/2014 0415   CREATININE 0.90 08/26/2013 0408   CALCIUM 8.3* 08/15/2014 0415   CALCIUM 8.1* 08/26/2013 0408   GFRNONAA >60 08/15/2014 0415   GFRNONAA >60 08/26/2013 0408   GFRAA >60 08/15/2014 0415   GFRAA >60 08/26/2013 0408   MRI:     CLINICAL DATA: Status post dog bite right foot 08/05/2014 with redness, pain and swelling. Draining wounds. Question abscess or osteomyelitis. History of rheumatoid arthritis and immunosuppressive therapy.  EXAM: MRI OF THE RIGHT FOREFOOT WITHOUT AND WITH CONTRAST  TECHNIQUE: Multiplanar, multisequence MR imaging was performed both before and after administration of intravenous contrast.  CONTRAST: 17 mL MULTIHANCE GADOBENATE DIMEGLUMINE 529 MG/ML IV SOLN  COMPARISON: Plain films of the right foot 08/10/2014 and 08/05/2014.  FINDINGS: Skin wounds are seen just distal to the lateral malleolus, lateral, posterior calcaneus and along the lateral margin of the foot just superior to the calcaneocuboid joint. Subcutaneous tissues about the wounds and over the dorsum of the foot demonstrate edema and  enhancement. A small fluid collection measuring 1.5 cm long by up to 0.7 cm transverse by 0.6 cm craniocaudal extends posterior and cephalad to the most distal wound. A second thin fluid collection distal to this wound is seen measuring 1.3 cm long by 0.4 cm transverse by 0.9 cm craniocaudal is identified.  The lateral malleolus is edematous with postcontrast enhancement. Edema and enhancement are also seen in the medial malleolus extending cephalad into the metaphysis, 3.5 cm above the tip of the medial malleolus. There is a defect in the cortex of the medial malleolus at its articulation with the talus. Small cysts are also identified in  the medial malleolus. Finally, there is edema and enhancement in the medial 1.3 cm of the navicular bone.  Tendons about the ankle are intact. No evidence of tenosynovitis is identified. Ligamentous structures about the ankle are also intact. The plantar fascia appears normal.  IMPRESSION: At least 3 skin wounds are identified consistent with history of dog bite. Changes consistent with cellulitis are seen about the foot with small fluid collections about the distal most wound consistent with abscesses.  Edema and enhancement in the lateral malleolus is most consistent with osteomyelitis given proximity to a skin wound. Edema and enhancement in the medial malleolus and medial margin of the navicular bone could be secondary to osteomyelitis or be related to the patient's history of rheumatoid arthritis.    Assessment/Planning: Dog bite right foot.   MRI was concerning for abscess and osteomyelitis. Osteomyelitis was along the distal fibula. I evaluated this and there was definite enhancement on the MRI and the distal fibula. Likely there is no signs of osteolysis is no bogginess, erythema, or drainage. Laceration to this area is healing very nicely. There is no dehiscence to this region.  The MRI was also concerning for possible abscess in the dorsal lateral aspect of the left foot along the distal laceration site. We'll to open the superficial scab in this area and no signs of purulent drainage were noted. It is very tender with palpation though. Once again this is not consistent with large abscess or drainable abscess in this region. Possible small abscess with hematoma could be what was noted on the MRI.  I discussed the case with infectious disease. We will cover the infection with possible PICC line and IV antibiotics for 2 weeks. We will closely monitor outpatient. Her white blood cell count was in the normal range once again inconsistent with severe infection to the foot.  She  should continue with range of motion to the right foot. She can ambulate as tolerated. Ended coverage over the wound with a dry sterile bandage but the patient states this is tender for her. I will see her back in the outpatient clinic early next week.  Gwyneth Revels A  08/15/2014, 12:43 PM

## 2014-08-15 NOTE — Care Management Note (Signed)
Case Management Note  Patient Details  Name: Marcia Pugh MRN: [id number] Date of Birth: Oct 21, 1970   Home health RN arranged through [facility] for home IV Antibiotics.  Collie Siad, RN 08/15/2014, 2:48 PM

## 2014-08-15 NOTE — Progress Notes (Signed)
Anvi continues IV ABX with no adverse effects, afebrile. Pain remains controlled with PRN medications. Julianna still has had no BM, PRN laxative given, requesting enema. Also, requesting medication for yeast infection. Family at bedside through the night. Nursing continues to monitor and assist with ADLs.

## 2014-08-15 NOTE — Care Management Note (Signed)
Case Management Note  Patient Details  Name: REGIS WILAND MRN: [id number] Date of Birth: 07-02-1970  Notified by RN that patient may have an abscess and per MD note patient having difficulty ambulating. She has crutches at home. RNCM will need to be notified if home health is needed, DME is needed, or IV therapy is needed at discharge.  Collie Siad, RN 08/15/2014, 10:07 AM

## 2014-08-15 NOTE — Progress Notes (Signed)
ANTIBIOTIC CONSULT NOTE - INITIAL  Pharmacy Consult for Vancomycin Indication: cellulitis/r/o osteomyelitis  Allergies  Allergen Reactions  . Sulfa Antibiotics Rash    Patient Measurements: Height: 5\' 8"  (172.7 cm) Weight: 185 lb 8 oz (84.142 kg) IBW/kg (Calculated) : 63.9 Adjusted Body Weight: 72  Vital Signs: Temp: 98 F (36.7 C) (05/06 1524) Temp Source: Oral (05/06 1524) BP: 137/87 mmHg (05/06 1524) Pulse Rate: 57 (05/06 1524) Intake/Output from previous day: 05/05 0701 - 05/06 0700 In: 1940 [P.O.:840; IV Piggyback:1100] Out: -  Intake/Output from this shift: Total I/O In: 480 [P.O.:480] Out: -   Labs:  Recent Labs  08/14/14 0415 08/15/14 0415  WBC  --  4.3  HGB 7.9* 8.4*  PLT  --  323  CREATININE 0.73 0.86   Estimated Creatinine Clearance: 94.9 mL/min (by C-G formula based on Cr of 0.86). No results for input(s): VANCOTROUGH, VANCOPEAK, VANCORANDOM, GENTTROUGH, GENTPEAK, GENTRANDOM, TOBRATROUGH, TOBRAPEAK, TOBRARND, AMIKACINPEAK, AMIKACINTROU, AMIKACIN in the last 72 hours.   Microbiology: No results found for this or any previous visit (from the past 720 hour(s)).  Medical History: Past Medical History  Diagnosis Date  . Arthritis   . Hypertension   . Anxiety   . Insomnia   . GERD (gastroesophageal reflux disease)   . Depression   . Hypothyroidism   . RA (rheumatoid arthritis)     On enbrel - follows with Dr 10/15/14    Medications:  Anti-infectives    Start     Dose/Rate Route Frequency Ordered Stop   08/15/14 2300  vancomycin (VANCOCIN) IVPB 750 mg/150 ml premix     750 mg 150 mL/hr over 60 Minutes Intravenous Every 8 hours 08/15/14 1558     08/15/14 1600  vancomycin (VANCOCIN) IVPB 750 mg/150 ml premix     750 mg 150 mL/hr over 60 Minutes Intravenous STAT 08/15/14 1558 08/16/14 1600   08/15/14 1400  piperacillin-tazobactam (ZOSYN) IVPB 3.375 g     3.375 g 12.5 mL/hr over 240 Minutes Intravenous 3 times per day 08/15/14 1342     08/15/14 0745  fluconazole (DIFLUCAN) tablet 150 mg     150 mg Oral  Once 08/15/14 0736 08/15/14 0905   08/11/14 0215  Ampicillin-Sulbactam (UNASYN) 3 g in sodium chloride 0.9 % 100 mL IVPB  Status:  Discontinued     3 g 100 mL/hr over 60 Minutes Intravenous Every 6 hours 08/11/14 0213 08/15/14 1342   08/10/14 2045  clindamycin (CLEOCIN) IVPB 600 mg     600 mg 100 mL/hr over 30 Minutes Intravenous  Once 08/10/14 2042 08/10/14 2224     Assessment: Cellulitis/possible osteomyelitis from infected dog bite  Goal of Therapy:  Vancomycin trough level 15-20 mcg/ml  Plan:  Measure antibiotic drug levels at steady state Follow up culture results Vancomycin 750 mg iv q 8 h with stacked dosing and a trough with the 5th dose.   2225 D 08/15/2014,3:59 PM

## 2014-08-15 NOTE — Progress Notes (Signed)
RD Assessment- LOS  Admitted with: vaginal drainage, swelling; cellulitis of foot following a dog bite April 2016 PMHx: HTN, GERD  Current Diet: Heart Healthy Typical Food/ Fluid Intake: 100% of meals recorded per I/O Meal/ Snack Patterns: Pt reports eating a regular diet with a good appetite and intake PTA.  Supplements: None  Food Allergies: NKFA Food Preferences: Reviewed  Ht: 68" Current weight: 185# BMI: 26.7 Weight Changes: Pt denies significant weight changes  UOP: Reviewed Digestive: Reviewed Gastrointestinal: Reviewed Skin: Reviewed Physical Findings: N/A  Labs: Electrolyte and Renal Profile:    Recent Labs Lab 08/11/14 0413 08/14/14 0415 08/15/14 0415  BUN 17 13 14   CREATININE 0.72 0.73 0.86  NA 137 137 139  K 3.4* 3.5 3.6   Protein Profile:  Recent Labs Lab 08/10/14 1838 08/11/14 0413  ALBUMIN 3.7 3.1*     Meds: Reviewed  PES Statement: No nutrition concerns at this time.  Intervention: Meals and Snacks: Cater to patient preferences  Monitoring/ Evaluation: Energy Intake: goal for patient to meet >90% of estimated needs.  LOW Care Level

## 2014-08-15 NOTE — Progress Notes (Signed)
Pts. Weight wasn't obtained d/t pts. Boyfriend was in the bed with pt.

## 2014-08-16 MED ORDER — VANCOMYCIN HCL IN DEXTROSE 750-5 MG/150ML-% IV SOLN: 750.0000 mg | Freq: Three times a day (TID) | INTRAVENOUS | Status: AC

## 2014-08-16 MED ORDER — OXYCODONE-ACETAMINOPHEN 7.5-325 MG PO TABS: 1.0000 | ORAL_TABLET | ORAL | Status: AC | PRN

## 2014-08-16 MED ORDER — ONDANSETRON HCL 4 MG PO TABS: 4.0000 mg | ORAL_TABLET | Freq: Four times a day (QID) | ORAL | Status: AC | PRN

## 2014-08-16 MED ORDER — PIPERACILLIN-TAZOBACTAM 3.375 G IVPB: 3.3750 g | Freq: Three times a day (TID) | INTRAVENOUS | Status: AC

## 2014-08-16 NOTE — Progress Notes (Signed)
Pt plan to be discharged this weekend with PICC line for IV ABX x 2 weeks for MRI results of possible osteomyelitis. Initial bleeding to PICC site stopped with pressure dressing at beginning of shift. Dressing changed and infused without incident. Pt anxious to learn about PICC line since she will be going home with it. Complained of pain to her right foot x 1 and was medication for that pain. Right foot less swollen and red. Area around stitches dark in color. MD note referred to areas as scabbed. Still complaining of constipation. Laxatives administered yesterday and will be continued today. Pt reports she has not been as mobile and eating as usual since she's been hospitalized.

## 2014-08-16 NOTE — Progress Notes (Signed)
Patient a&o, vital signs stable. IV antibiotics infusing. Order to discharge home with home health with antibiotics IV. PICC in place in right upper arm, will go with patient upon d/c. Instructions and prescriptions given to patient, verbalized understanding. Waiting on Zosyn to be finished infusing, and ride for patient will not be here until after 4 pm.

## 2014-08-16 NOTE — Progress Notes (Signed)
A request for home health IV antibiotics and a RN to administer and teach IV antibiotics was faxed and called to [facility].

## 2014-08-16 NOTE — Progress Notes (Signed)
Daily Progress Note   Subjective  - * No surgery found *  Follow-up right foot dog bite. New complaints today. Pain laterally along the foot.  Objective Filed Vitals:   08/15/14 1524 08/15/14 1935 08/16/14 0442 08/16/14 0915  BP: 137/87 133/68 114/72 121/65  Pulse: 57 63 64 61  Temp: 98 F (36.7 C) 98.6 F (37 C) 97.8 F (36.6 C) 97.9 F (36.6 C)  TempSrc: Oral Oral Oral Oral  Resp: 17 20 18 17   Height:      Weight:   79.833 kg (176 lb)   SpO2: 98% 98% 99% 99%    Physical Exam: The erythema is still stable at this point. No worsening erythema. No overt drainage. The removed the scab yesterday has healed up. There is no real evidence from this region. His. No any signs of abscess. As the fibula this wound continues to look very healthy without signs of overt infection.  Laboratory CBC    Component Value Date/Time   WBC 4.3 08/15/2014 0415   WBC 4.8 08/26/2013 0408   HGB 8.4* 08/15/2014 0415   HGB 9.8* 08/26/2013 0408   HCT 27.6* 08/15/2014 0415   HCT 30.7* 08/26/2013 0408   PLT 323 08/15/2014 0415   PLT 169 08/26/2013 0408    BMET    Component Value Date/Time   NA 139 08/15/2014 0415   NA 138 08/26/2013 0408   K 3.6 08/15/2014 0415   K 4.3 08/26/2013 0408   CL 102 08/15/2014 0415   CL 107 08/26/2013 0408   CO2 31 08/15/2014 0415   CO2 29 08/26/2013 0408   GLUCOSE 87 08/15/2014 0415   GLUCOSE 107* 08/26/2013 0408   BUN 14 08/15/2014 0415   BUN 13 08/26/2013 0408   CREATININE 0.86 08/15/2014 0415   CREATININE 0.90 08/26/2013 0408   CALCIUM 8.3* 08/15/2014 0415   CALCIUM 8.1* 08/26/2013 0408   GFRNONAA >60 08/15/2014 0415   GFRNONAA >60 08/26/2013 0408   GFRAA >60 08/15/2014 0415   GFRAA >60 08/26/2013 0408    Assessment/Planning: Once again continue with antibiotics. She has a PICC line in place and continue with this. I will see her early next week upon discharge    08/28/2013  08/16/2014, 9:34 AM

## 2014-08-16 NOTE — Discharge Instructions (Signed)
.  d DIET:  Cardiac diet  DISCHARGE CONDITION:  Stable  ACTIVITY:  Activity as tolerated with rolling walker (pt already has)  OXYGEN:  Home Oxygen: No.   Oxygen Delivery: room air  DISCHARGE LOCATION:  home   If you experience worsening of your admission symptoms, develop shortness of breath, life threatening emergency, suicidal or homicidal thoughts you must seek medical attention immediately by calling 911 or calling your MD immediately  if symptoms less severe.  You Must read complete instructions/literature along with all the possible adverse reactions/side effects for all the Medicines you take and that have been prescribed to you. Take any new Medicines after you have completely understood and accpet all the possible adverse reactions/side effects.   Please note  You were cared for by a hospitalist during your hospital stay. If you have any questions about your discharge medications or the care you received while you were in the hospital after you are discharged, you can call the unit and asked to speak with the hospitalist on call if the hospitalist that took care of you is not available. Once you are discharged, your primary care physician will handle any further medical issues. Please note that NO REFILLS for any discharge medications will be authorized once you are discharged, as it is imperative that you return to your primary care physician (or establish a relationship with a primary care physician if you do not have one) for your aftercare needs so that they can reassess your need for medications and monitor your lab values.

## 2014-08-16 NOTE — Discharge Summary (Signed)
Marcia Pugh, 44 y.o., DOB May 02, 1970, MRN [id number]. Admission date: 08/10/2014 Discharge Date 08/16/2014 Primary MD No primary care provider on file. Admitting Physician Crissie Figures, MD  Admission Diagnosis  Cellulitis of right lower extremity [L03.115]  Discharge Diagnosis   Principal Problem:   Cellulitis of foot, right Active Problems:   HTN (hypertension)   Anemia   GERD (gastroesophageal reflux disease)   Depression   Arthritis   Hypothyroidism      Past Medical History  Diagnosis Date  . Arthritis   . Hypertension   . Anxiety   . Insomnia   . GERD (gastroesophageal reflux disease)   . Depression   . Hypothyroidism   . RA (rheumatoid arthritis)     On enbrel - follows with Dr Beverley Fiedler    Past Surgical History  Procedure Laterality Date  . Cholecystectomy    . Tubal ligation        Hospital Course See H&P, Labs, Consult and Test reports for all details in brief, patient was admitted for   Pt was admitted after she was bit by a dog on her right foot. Patient was noted to have cellulitis and admitted to the hospital for further aggressive therapy and treatment. She was seen in consultation by podiatry as well as infectious disease. Patient continued to have severe pain in her right foot therefore had an MRI done which she did show possible small abscesses as well as possible osteomyelitis. Dr. Ether Griffins did not feel that there was anything that needed to be drained. He recommended aggressive IV antibiotics. Dr. Sampson Goon recommended IV Zosyn and vancomycin which is currently being arranged for home. Patient also has had a PICC line placed and will have home health following her as well she will have blood work done which will be sent to Dr. Sampson Goon to monitor CBP and CBC and a CMP.    Principal Problem:   Cellulitis of foot, right Active Problems:   HTN (hypertension)   Anemia   GERD (gastroesophageal reflux disease)   Depression   Arthritis    Hypothyroidism    Consults  ID,podiatry  Significant Tests:  See full reports for all details    Dg Ankle Complete Right  08/05/2014   CLINICAL DATA:  Status post dog bite today with multiple lacerations. Initial encounter.  EXAM: RIGHT ANKLE - COMPLETE 3+ VIEW  COMPARISON:  None.  FINDINGS: No radiopaque foreign body is identified. There is no fracture or dislocation. Lacerations are identified about the ankle.  IMPRESSION: Lacerations without underlying fracture or foreign body.   Electronically Signed   By: Drusilla Kanner M.D.   On: 08/05/2014 16:38   Mr Foot Right W Wo Contrast  08/15/2014   CLINICAL DATA:  Status post dog bite right foot 08/05/2014 with redness, pain and swelling. Draining wounds. Question abscess or osteomyelitis. History of rheumatoid arthritis and immunosuppressive therapy.  EXAM: MRI OF THE RIGHT FOREFOOT WITHOUT AND WITH CONTRAST  TECHNIQUE: Multiplanar, multisequence MR imaging was performed both before and after administration of intravenous contrast.  CONTRAST:  17 mL MULTIHANCE GADOBENATE DIMEGLUMINE 529 MG/ML IV SOLN  COMPARISON:  Plain films of the right foot 08/10/2014 and 08/05/2014.  FINDINGS: Skin wounds are seen just distal to the lateral malleolus, lateral, posterior calcaneus and along the lateral margin of the foot just superior to the calcaneocuboid joint. Subcutaneous tissues about the wounds and over the dorsum of the foot demonstrate edema and enhancement. A small fluid collection measuring 1.5 cm long  by up to 0.7 cm transverse by 0.6 cm craniocaudal extends posterior and cephalad to the most distal wound. A second thin fluid collection distal to this wound is seen measuring 1.3 cm long by 0.4 cm transverse by 0.9 cm craniocaudal is identified.  The lateral malleolus is edematous with postcontrast enhancement. Edema and enhancement are also seen in the medial malleolus extending cephalad into the metaphysis, 3.5 cm above the tip of the medial malleolus.  There is a defect in the cortex of the medial malleolus at its articulation with the talus. Small cysts are also identified in the medial malleolus. Finally, there is edema and enhancement in the medial 1.3 cm of the navicular bone.  Tendons about the ankle are intact. No evidence of tenosynovitis is identified. Ligamentous structures about the ankle are also intact. The plantar fascia appears normal.  IMPRESSION: At least 3 skin wounds are identified consistent with history of dog bite. Changes consistent with cellulitis are seen about the foot with small fluid collections about the distal most wound consistent with abscesses.  Edema and enhancement in the lateral malleolus is most consistent with osteomyelitis given proximity to a skin wound. Edema and enhancement in the medial malleolus and medial margin of the navicular bone could be secondary to osteomyelitis or be related to the patient's history of rheumatoid arthritis.   Electronically Signed   By: Drusilla Kanner M.D.   On: 08/15/2014 07:59   Dg Chest Port 1 View  08/15/2014   CLINICAL DATA:  PICC line placement.  Recent dog bite.  EXAM: PORTABLE CHEST - 1 VIEW  COMPARISON:  Right rib radiographs 04/27/2010  FINDINGS: Right PICC has been placed and terminates over the lower SVC/ cavoatrial junction. Cardiomediastinal silhouette is within normal limits. Lungs are well inflated and clear. No pleural effusion or pneumothorax is identified. No acute osseous abnormality is identified.  IMPRESSION: Right PICC placement as above.   Electronically Signed   By: Sebastian Ache   On: 08/15/2014 19:04   Dg Foot Complete Right  08/10/2014   CLINICAL DATA:  Dog bite 2 days ago.  EXAM: RIGHT FOOT COMPLETE - 3+ VIEW  COMPARISON:  08/05/2014  FINDINGS: There are mild soft tissue irregularities in the location of the previously observed laceration. There is no soft tissue gas. There is no bony abnormality. There is no radiopaque foreign body.  IMPRESSION: Negative for  fracture, dislocation, radiopaque foreign body or soft tissue gas.   Electronically Signed   By: Ellery Plunk M.D.   On: 08/10/2014 21:35   Dg Foot Complete Right  08/05/2014   CLINICAL DATA:  Multiple lacerations to the right foot along the dorsal and lateral surfaces  EXAM: RIGHT FOOT COMPLETE - 3+ VIEW  COMPARISON:  None.  FINDINGS: There is no evidence of fracture or dislocation. There is mild osteoarthritis of the first MTP joint and third PIP joint. Soft tissue laceration along the anterior lateral ankle.  IMPRESSION: No acute osseous injury of the right foot. Soft tissue laceration along the anterior lateral aspect of the ankle.   Electronically Signed   By: Elige Ko   On: 08/05/2014 19:50       Today   Subjective:   Klee Kolek today has no headache,no chest abdominal pain,no new weakness tingling or numbness, feels much better wants to go home today.   Objective:   Blood pressure 121/65, pulse 61, temperature 97.9 F (36.6 C), temperature source Oral, resp. rate 17, height 5\' 8"  (1.727 m), weight  79.833 kg (176 lb), last menstrual period 08/10/2014, SpO2 99 %.  .  Intake/Output Summary (Last 24 hours) at 08/16/14 1357 Last data filed at 08/16/14 1316  Gross per 24 hour  Intake    950 ml  Output      0 ml  Net    950 ml    Exam Awake Alert, Oriented *3, No new F.N deficits, Normal affect [place].AT,PERRAL Supple Neck,No JVD, No cervical lymphadenopathy appreciated.  Symmetrical Chest wall movement, Good air movement bilaterally, CTAB RRR,No Gallops,Rubs or new Murmurs, No Parasternal Heave +ve B.Sounds, Abd Soft, Non tender, No organomegaly appriciated, No rebound -guarding or rigidity. No Cyanosis, Clubbing or edema, No new Rash or bruise  Data Review   Cultures -   CBC w Diff: Lab Results  Component Value Date   WBC 4.3 08/15/2014   WBC 4.8 08/26/2013   HGB 8.4* 08/15/2014   HGB 9.8* 08/26/2013   HCT 27.6* 08/15/2014   HCT 30.7* 08/26/2013   PLT 323  08/15/2014   PLT 169 08/26/2013   LYMPHOPCT 24% 08/10/2014   LYMPHOPCT 27.2 08/26/2013   MONOPCT 12% 08/10/2014   MONOPCT 14.6 08/26/2013   EOSPCT 4% 08/10/2014   EOSPCT 2.4 08/26/2013   BASOPCT 1% 08/10/2014   BASOPCT 0.3 08/26/2013   CMP: Lab Results  Component Value Date   NA 139 08/15/2014   NA 138 08/26/2013   K 3.6 08/15/2014   K 4.3 08/26/2013   CL 102 08/15/2014   CL 107 08/26/2013   CO2 31 08/15/2014   CO2 29 08/26/2013   BUN 14 08/15/2014   BUN 13 08/26/2013   CREATININE 0.86 08/15/2014   CREATININE 0.90 08/26/2013   PROT 6.7 08/11/2014   PROT 7.1 08/26/2013   ALBUMIN 3.1* 08/11/2014   ALBUMIN 3.0* 08/26/2013   BILITOT 0.4 08/11/2014   ALKPHOS 61 08/11/2014   ALKPHOS 60 08/26/2013   AST 31 08/11/2014   AST 56* 08/26/2013   ALT 30 08/11/2014   ALT 75 08/26/2013  .  Micro Results No results found for this or any previous visit (from the past 240 hour(s)).   Discharge Instructions      Follow-up Information    Follow up with Irean Hong, DPM On 08/21/2014.   Specialty:  Podiatry   Why:  Appointment is at 2:45   Contact information:   [address] [facility] [place] [address] [phone]       Follow up with Sampson Goon, DAVID, MD. Call in 2 weeks.   Specialty:  Infectious Diseases   Why:  for foot infection   Contact information:   [address] [facility] [place] [address] [phone]       Discharge Medications     Medication List    STOP taking these medications        oxyCODONE-acetaminophen 5-325 MG per tablet  Commonly known as:  PERCOCET/ROXICET  Replaced by:  oxyCODONE-acetaminophen 7.5-325 MG per tablet      TAKE these medications        Etanercept 25 MG/0.5ML Sosy  Inject into the skin.     levothyroxine 112 MCG tablet  Commonly known as:  SYNTHROID, LEVOTHROID  Take 112 mcg by mouth daily before breakfast.     lisinopril 10 MG  tablet  Commonly known as:  PRINIVIL,ZESTRIL  Take 10 mg by mouth daily.     omeprazole 20 MG capsule  Commonly known as:  PRILOSEC  Take  20 mg by mouth daily.     ondansetron 4 MG tablet  Commonly known as:  ZOFRAN  Take 1 tablet (4 mg total) by mouth every 6 (six) hours as needed for nausea.     oxyCODONE-acetaminophen 7.5-325 MG per tablet  Commonly known as:  PERCOCET  Take 1 tablet by mouth every 4 (four) hours as needed for severe pain.     piperacillin-tazobactam 3.375 GM/50ML IVPB  Commonly known as:  ZOSYN  Inject 50 mLs (3.375 g total) into the vein every 8 (eight) hours.     traZODone 100 MG tablet  Commonly known as:  DESYREL  Take 100 mg by mouth at bedtime.     Vancomycin 750 MG/150ML Soln  Commonly known as:  VANCOCIN  Inject 150 mLs (750 mg total) into the vein every 8 (eight) hours.     venlafaxine 50 MG tablet  Commonly known as:  EFFEXOR  Take 50 mg by mouth 2 (two) times daily with a meal.         Total Time in preparing paper work, data evaluation and todays exam - 35 minutes  Auburn Bilberry M.D on 08/16/2014 at 1:57 PM  [facility]   Office  [phone]

## 2014-08-19 ENCOUNTER — Emergency Department
Admission: EM | Admit: 2014-08-19 | Discharge: 2014-08-19 | Disposition: A | Discharge status: Home / Self Care | Payer: Medicare Other | Attending: Emergency Medicine | Admitting: Emergency Medicine

## 2014-08-19 DIAGNOSIS — I10 Essential (primary) hypertension: Secondary | ICD-10-CM | POA: Insufficient documentation

## 2014-08-19 DIAGNOSIS — W540XXD Bitten by dog, subsequent encounter: Secondary | ICD-10-CM | POA: Diagnosis not present

## 2014-08-19 DIAGNOSIS — W540XXA Bitten by dog, initial encounter: Secondary | ICD-10-CM

## 2014-08-19 DIAGNOSIS — Z79899 Other long term (current) drug therapy: Secondary | ICD-10-CM | POA: Insufficient documentation

## 2014-08-19 DIAGNOSIS — Z792 Long term (current) use of antibiotics: Secondary | ICD-10-CM | POA: Insufficient documentation

## 2014-08-19 DIAGNOSIS — S91351D Open bite, right foot, subsequent encounter: Secondary | ICD-10-CM | POA: Diagnosis present

## 2014-08-19 MED ORDER — RABIES VACCINE, PCEC IM SUSR
1.0000 mL | Freq: Once | INTRAMUSCULAR | Status: AC
  Administered 2014-08-19: 1 mL via INTRAMUSCULAR

## 2014-08-19 MED ORDER — RABIES VACCINE, PCEC IM SUSR
INTRAMUSCULAR | Status: AC
  Filled 2014-08-19: qty 1

## 2014-08-19 NOTE — ED Provider Notes (Signed)
[facility] Emergency Department Provider Note    ____________________________________________  Time seen: 2150   I have reviewed the triage vital signs and the nursing notes.   HISTORY  Chief Complaint Rabies Injection       HPI Marcia Pugh is a 44 y.o. female if her rabies vaccine was bitten by a dog approximately 2 weeks ago has on her last shot of the series no other complaints at this time wound site is currently being treated for osteomyelitis patient says that it appears to be doing well     Past Medical History  Diagnosis Date  . Arthritis   . Hypertension   . Anxiety   . Insomnia   . GERD (gastroesophageal reflux disease)   . Depression   . Hypothyroidism   . RA (rheumatoid arthritis)     On enbrel - follows with Dr Beverley Fiedler    Patient Active Problem List   Diagnosis Date Noted  . HTN (hypertension) 08/11/2014  . Anemia 08/11/2014  . GERD (gastroesophageal reflux disease) 08/11/2014  . Depression 08/11/2014  . Arthritis 08/11/2014  . Hypothyroidism 08/11/2014  . Cellulitis of foot, right 08/10/2014    Past Surgical History  Procedure Laterality Date  . Cholecystectomy    . Tubal ligation      Current Outpatient Rx  Name  Route  Sig  Dispense  Refill  . Etanercept 25 MG/0.5ML SOSY   Subcutaneous   Inject into the skin.         Marland Kitchen levothyroxine (SYNTHROID, LEVOTHROID) 112 MCG tablet   Oral   Take 112 mcg by mouth daily before breakfast.          . lisinopril (PRINIVIL,ZESTRIL) 10 MG tablet   Oral   Take 10 mg by mouth daily.          Marland Kitchen omeprazole (PRILOSEC) 20 MG capsule   Oral   Take 20 mg by mouth daily.          . ondansetron (ZOFRAN) 4 MG tablet   Oral   Take 1 tablet (4 mg total) by mouth every 6 (six) hours as needed for nausea.   20 tablet   0   . oxyCODONE-acetaminophen (PERCOCET) 7.5-325 MG per tablet   Oral   Take 1 tablet by mouth every 4 (four) hours as needed for severe pain.  40 tablet   0   . piperacillin-tazobactam (ZOSYN) 3.375 GM/50ML IVPB   Intravenous   Inject 50 mLs (3.375 g total) into the vein every 8 (eight) hours.   50 mL   42   . traZODone (DESYREL) 100 MG tablet   Oral   Take 100 mg by mouth at bedtime.          . Vancomycin (VANCOCIN) 750 MG/150ML SOLN   Intravenous   Inject 150 mLs (750 mg total) into the vein every 8 (eight) hours.   150 mL   42   . venlafaxine (EFFEXOR) 50 MG tablet   Oral   Take 50 mg by mouth 2 (two) times daily with a meal.            Allergies Sulfa antibiotics  Family History  Problem Relation Age of Onset  . Lymphoma Mother   . Diabetes type II Other     Social History History  Substance Use Topics  . Smoking status: Never Smoker   . Smokeless tobacco: Not on file  . Alcohol Use: No    Review of Systems  As  systems negative 6 systems as reviewed the patient's upper noted in the history of present illness  ____________________________________________   PHYSICAL EXAM:  VITAL SIGNS: ED Triage Vitals  Enc Vitals Group     BP 08/19/14 2114 139/69 mmHg     Pulse Rate 08/19/14 2114 64     Resp 08/19/14 2114 18     Temp 08/19/14 2114 98.2 F (36.8 C)     Temp Source 08/19/14 2114 Oral     SpO2 08/19/14 2114 100 %     Weight 08/19/14 2114 175 lb (79.379 kg)     Height 08/19/14 2114 5\' 8"  (1.727 m)     Head Cir --      Peak Flow --      Pain Score 08/19/14 2115 7     Pain Loc --      Pain Edu? --      Excl. in GC? --     Physical exam reveals Caucasian female appearing stated a well-developed well-nourished in no acute distress Cardiovascular regular rhythm no murmurs details. Lungs clear to auscultation bilaterally Patient's right foot still red and tender full range of motion good sensation throughout patient has PICC line in right arm that's in place without apparent complication   ____________________________________________   INITIAL IMPRESSION / ASSESSMENT AND PLAN /  ED COURSE  Pertinent labs & imaging results that were available during my care of the patient were reviewed by me and considered in my medical decision making (see chart for details).  Dogbite subsequent encounter rabies vaccination patient was here for her rabies vaccine  ____________________________________________   FINAL CLINICAL IMPRESSION(S) / ED DIAGNOSES  Final diagnoses:  Dog bite     Letetia Romanello 2116, PA-C 08/19/14 2218  2219, MD 08/23/14 1659

## 2014-08-19 NOTE — ED Notes (Signed)
Pt to triage via w/c with no distress noted; reports here for last rabies inj

## 2014-08-19 NOTE — Discharge Instructions (Signed)

## 2014-09-19 ENCOUNTER — Encounter: Payer: Self-pay | Admitting: Intensive Care

## 2014-09-19 ENCOUNTER — Emergency Department
Admission: EM | Admit: 2014-09-19 | Discharge: 2014-09-19 | Discharge status: Against Medical Advice | Payer: Medicare Other | Attending: Emergency Medicine | Admitting: Emergency Medicine

## 2014-09-19 DIAGNOSIS — R109 Unspecified abdominal pain: Secondary | ICD-10-CM | POA: Diagnosis not present

## 2014-09-19 DIAGNOSIS — I10 Essential (primary) hypertension: Secondary | ICD-10-CM | POA: Insufficient documentation

## 2014-09-19 LAB — LIPASE, BLOOD: Lipase: 35 U/L (ref 22–51)

## 2014-09-19 LAB — COMPREHENSIVE METABOLIC PANEL
ALBUMIN: 4.1 g/dL (ref 3.5–5.0)
ALK PHOS: 96 U/L (ref 38–126)
ALT: 39 U/L (ref 14–54)
AST: 56 U/L — ABNORMAL HIGH (ref 15–41)
Anion gap: 13 (ref 5–15)
BUN: 15 mg/dL (ref 6–20)
CALCIUM: 8.6 mg/dL — AB (ref 8.9–10.3)
CO2: 21 mmol/L — ABNORMAL LOW (ref 22–32)
Chloride: 103 mmol/L (ref 101–111)
Creatinine, Ser: 0.83 mg/dL (ref 0.44–1.00)
GFR calc Af Amer: >60 mL/min (ref >60)
GFR calc non Af Amer: >60 mL/min (ref >60)
Glucose, Bld: 89 mg/dL (ref 65–99)
Potassium: 3.6 mmol/L (ref 3.5–5.1)
SODIUM: 137 mmol/L (ref 135–145)
TOTAL PROTEIN: 8.8 g/dL — AB (ref 6.5–8.1)
Total Bilirubin: 0.6 mg/dL (ref 0.3–1.2)

## 2014-09-19 LAB — URINALYSIS COMPLETE WITH MICROSCOPIC (ARMC ONLY)
BILIRUBIN URINE: NEGATIVE
GLUCOSE, UA: NEGATIVE mg/dL
KETONES UR: NEGATIVE mg/dL
Leukocytes, UA: NEGATIVE
Nitrite: NEGATIVE
PROTEIN: 100 mg/dL — AB
SPECIFIC GRAVITY, URINE: 1.01 (ref 1.005–1.030)
pH: 5 (ref 5.0–8.0)

## 2014-09-19 LAB — CBC WITH DIFFERENTIAL/PLATELET
BASOS ABS: 0 10*3/uL (ref 0–0.1)
Basophils Relative: 1 %
Eosinophils Absolute: 0.1 10*3/uL (ref 0–0.7)
Eosinophils Relative: 1 %
HCT: 33.6 % — ABNORMAL LOW (ref 35.0–47.0)
Hemoglobin: 10.7 g/dL — ABNORMAL LOW (ref 12.0–16.0)
Lymphocytes Relative: 37 %
Lymphs Abs: 1.9 10*3/uL (ref 1.0–3.6)
MCH: 24.4 pg — AB (ref 26.0–34.0)
MCHC: 31.9 g/dL — AB (ref 32.0–36.0)
MCV: 76.5 fL — ABNORMAL LOW (ref 80.0–100.0)
MONOS PCT: 8 %
Monocytes Absolute: 0.4 10*3/uL (ref 0.2–0.9)
NEUTROS PCT: 53 %
Neutro Abs: 2.8 10*3/uL (ref 1.4–6.5)
Platelets: 317 10*3/uL (ref 150–440)
RBC: 4.39 MIL/uL (ref 3.80–5.20)
RDW: 23.4 % — ABNORMAL HIGH (ref 11.5–14.5)
WBC: 5.3 10*3/uL (ref 3.6–11.0)

## 2014-09-19 LAB — TROPONIN I: Troponin I: <0.03 ng/mL (ref <0.031)

## 2014-09-19 NOTE — ED Notes (Signed)
Pt seen leaving the ED, Pt informed a NA that she was leaving, she was no longer in pain

## 2014-09-19 NOTE — ED Notes (Signed)
Pt eloped, waiting room and ED searched for pt.

## 2014-09-19 NOTE — ED Notes (Signed)
Pt reports LUQ abdominal pain starting today. Pt currently denies any pain

## 2014-09-19 NOTE — ED Notes (Addendum)
Pt c/o Left sided abdominal pain. Pt states she was sitting on her porch and the pain started suddenly. Pt is experiencing Nausea with no emesis

## 2014-10-20 ENCOUNTER — Encounter: Payer: Self-pay | Admitting: Urgent Care

## 2014-10-20 ENCOUNTER — Emergency Department
Admission: EM | Admit: 2014-10-20 | Discharge: 2014-10-20 | Disposition: A | Discharge status: Home / Self Care | Payer: Medicare Other | Attending: Emergency Medicine | Admitting: Emergency Medicine

## 2014-10-20 DIAGNOSIS — S91051D Open bite, right ankle, subsequent encounter: Secondary | ICD-10-CM | POA: Diagnosis not present

## 2014-10-20 DIAGNOSIS — I10 Essential (primary) hypertension: Secondary | ICD-10-CM | POA: Diagnosis not present

## 2014-10-20 DIAGNOSIS — Z79899 Other long term (current) drug therapy: Secondary | ICD-10-CM | POA: Insufficient documentation

## 2014-10-20 DIAGNOSIS — R1032 Left lower quadrant pain: Secondary | ICD-10-CM | POA: Diagnosis not present

## 2014-10-20 DIAGNOSIS — R300 Dysuria: Secondary | ICD-10-CM | POA: Insufficient documentation

## 2014-10-20 DIAGNOSIS — Z3202 Encounter for pregnancy test, result negative: Secondary | ICD-10-CM | POA: Insufficient documentation

## 2014-10-20 DIAGNOSIS — W540XXD Bitten by dog, subsequent encounter: Secondary | ICD-10-CM | POA: Diagnosis not present

## 2014-10-20 LAB — URINALYSIS COMPLETE WITH MICROSCOPIC (ARMC ONLY)
BILIRUBIN URINE: NEGATIVE
Bacteria, UA: NONE SEEN
Glucose, UA: NEGATIVE mg/dL
Hgb urine dipstick: NEGATIVE
Ketones, ur: NEGATIVE mg/dL
Nitrite: NEGATIVE
Protein, ur: 30 mg/dL — AB
Specific Gravity, Urine: 1.029 (ref 1.005–1.030)
pH: 5 (ref 5.0–8.0)

## 2014-10-20 LAB — POCT PREGNANCY, URINE: Preg Test, Ur: NEGATIVE

## 2014-10-20 MED ORDER — PHENAZOPYRIDINE HCL 200 MG PO TABS
200.0000 mg | ORAL_TABLET | Freq: Once | ORAL | Status: AC
  Administered 2014-10-20: 200 mg via ORAL
  Filled 2014-10-20: qty 1

## 2014-10-20 MED ORDER — PHENAZOPYRIDINE HCL 100 MG PO TABS: 100.0000 mg | ORAL_TABLET | Freq: Three times a day (TID) | ORAL | Status: AC | PRN

## 2014-10-20 NOTE — ED Provider Notes (Signed)
[facility] Emergency Department Provider Note  ____________________________________________  Time seen: 2035   I have reviewed the triage vital signs and the nursing notes.   HISTORY  Chief Complaint Dysuria and Abdominal Pain     HPI Marcia Pugh is a 44 y.o. female who is experiencing urinary urgency, dysuria, and some supra pubic pain in the center and to the left.  This is been occurring for 3-4 days. The patient reports having history of prior urinary tract infections and kidney stones. This has not been a problem for a while.  She denies any fever. She is not having any nausea vomiting or diarrhea.  The patient has a notable history of a dog bite to the right ankle approximate 6 weeks ago. This led to the need for hospitalization at one point and a subsequent PICC line and ongoing antibiotics. She is not taking any antibiotics currently.   Past Medical History  Diagnosis Date  . Arthritis   . Hypertension   . Anxiety   . Insomnia   . GERD (gastroesophageal reflux disease)   . Depression   . Hypothyroidism   . RA (rheumatoid arthritis)     On enbrel - follows with Dr Beverley Fiedler    Patient Active Problem List   Diagnosis Date Noted  . HTN (hypertension) 08/11/2014  . Anemia 08/11/2014  . GERD (gastroesophageal reflux disease) 08/11/2014  . Depression 08/11/2014  . Arthritis 08/11/2014  . Hypothyroidism 08/11/2014  . Cellulitis of foot, right 08/10/2014    Past Surgical History  Procedure Laterality Date  . Cholecystectomy    . Tubal ligation      Current Outpatient Rx  Name  Route  Sig  Dispense  Refill  . Etanercept 25 MG/0.5ML SOSY   Subcutaneous   Inject into the skin.         Marland Kitchen levothyroxine (SYNTHROID, LEVOTHROID) 112 MCG tablet   Oral   Take 112 mcg by mouth daily before breakfast.          . lisinopril (PRINIVIL,ZESTRIL) 10 MG tablet   Oral   Take 10 mg by mouth daily.          Marland Kitchen omeprazole (PRILOSEC) 20  MG capsule   Oral   Take 20 mg by mouth daily.          . ondansetron (ZOFRAN) 4 MG tablet   Oral   Take 1 tablet (4 mg total) by mouth every 6 (six) hours as needed for nausea.   20 tablet   0   . oxyCODONE-acetaminophen (PERCOCET) 7.5-325 MG per tablet   Oral   Take 1 tablet by mouth every 4 (four) hours as needed for severe pain.   40 tablet   0   . phenazopyridine (PYRIDIUM) 100 MG tablet   Oral   Take 1 tablet (100 mg total) by mouth 3 (three) times daily as needed for pain.   6 tablet   0   . traZODone (DESYREL) 100 MG tablet   Oral   Take 100 mg by mouth at bedtime.          Marland Kitchen venlafaxine (EFFEXOR) 50 MG tablet   Oral   Take 50 mg by mouth 2 (two) times daily with a meal.            Allergies Sulfa antibiotics  Family History  Problem Relation Age of Onset  . Lymphoma Mother   . Diabetes type II Other     Social History History  Substance Use Topics  . Smoking status: Never Smoker   . Smokeless tobacco: Not on file  . Alcohol Use: No    Review of Systems  Constitutional: Negative for fever. ENT: Negative for sore throat. Cardiovascular: Negative for chest pain. Respiratory: Negative for shortness of breath. Gastrointestinal: Negative for abdominal pain, vomiting and diarrhea. Genitourinary: Positive for dysuria. Musculoskeletal: No myalgias or injuries. Skin: Negative for rash. Healing wounds from a dog bite on the right ankle. Neurological: Negative for headaches   10-point ROS otherwise negative.  ____________________________________________   PHYSICAL EXAM:  VITAL SIGNS: ED Triage Vitals  Enc Vitals Group     BP 10/20/14 2008 117/65 mmHg     Pulse Rate 10/20/14 2008 72     Resp 10/20/14 2008 16     Temp 10/20/14 2008 98.6 F (37 C)     Temp Source 10/20/14 2008 Oral     SpO2 10/20/14 2008 100 %     Weight 10/20/14 2008 165 lb (74.844 kg)     Height 10/20/14 2008 5\' 8"  (1.727 m)     Head Cir --      Peak Flow --       Pain Score 10/20/14 2008 7     Pain Loc --      Pain Edu? --      Excl. in GC? --     Constitutional:  Alert and oriented. Well appearing and in no distress. ENT   Head: Normocephalic and atraumatic. Cardiovascular: Normal rate, regular rhythm, no murmur noted Respiratory:  Normal respiratory effort, no tachypnea.    Breath sounds are clear and equal bilaterally.  Gastrointestinal: Soft, with tenderness in the suprapubic area and extending to the left lower abdomen.. No distention.  Back: No muscle spasm, no tenderness, no CVA tenderness. Musculoskeletal: No deformity noted. Nontender with normal range of motion in all extremities.  No noted edema. Neurologic:  Normal speech and language. No gross focal neurologic deficits are appreciated.  Skin:  Skin is warm, dry. No rash noted.  Healing dog bite wounds to the right ankle without erythema. Psychiatric: Mood and affect are normal. Speech and behavior are normal.  ____________________________________________    LABS (pertinent positives/negatives)  Urinalysis: White blood cells negative red blood cells negative leukocyte esterase 1+, nitrites negative, epithelial cells 6-30.  ____________________________________________   INITIAL IMPRESSION / ASSESSMENT AND PLAN / ED COURSE  Pertinent labs & imaging results that were available during my care of the patient were reviewed by me and considered in my medical decision making (see chart for details).  Patient with dysuria worrisome for a urinary tract infection. She does not have any CVA tenderness or sign of pyelonephritis. She overall looks comfortable.   Her urinalysis shows no significant sign of an infection. Given that this patient was recently treated with an extensive amount of IV and by mouth antibiotics due to her dog bite situation, I feel it is best not to use antibiotics in this situation unless there are other signs of a urinary tract infection. We will culture the urine  and asked her to follow-up with her regular doctor.  I will put her on pyridium.  ____________________________________________   FINAL CLINICAL IMPRESSION(S) / ED DIAGNOSES  Final diagnoses:  Dysuria      2009, MD 10/20/14 2055

## 2014-10-20 NOTE — ED Notes (Addendum)
Patient presents with c/o LLQ abd pain with (+) radiation down into groin. Symptoms started yesterday. Denies N/V/D/F. (+) dysuria and frequency reported.

## 2014-10-20 NOTE — ED Notes (Signed)
Patient with no complaints at this time. Respirations even and unlabored. Skin warm/dry. Discharge instructions reviewed with patient at this time. Patient given opportunity to voice concerns/ask questions. Patient discharged at this time and left Emergency Department with steady gait.   

## 2014-10-20 NOTE — Discharge Instructions (Signed)
Your urine did not show sign of a urinary tract infection. Drink more water. Take Pyridium if needed. The Pyridium may turn your urine and aren't reddish color.  Follow-up with your regular doctor or at [facility] clinic for any ongoing concerns. Return to the emergency department if your pain worsens, if you have fever, or if you have other urgent concerns.   Dysuria Dysuria is the medical term for pain with urination. There are many causes for dysuria, but urinary tract infection is the most common. If a urinalysis was performed it can show that there is a urinary tract infection. A urine culture confirms that you or your child is sick. You will need to follow up with a healthcare provider because:  If a urine culture was done you will need to know the culture results and treatment recommendations.  If the urine culture was positive, you or your child will need to be put on antibiotics or know if the antibiotics prescribed are the right antibiotics for your urinary tract infection.  If the urine culture is negative (no urinary tract infection), then other causes may need to be explored or antibiotics need to be stopped. Today laboratory work may have been done and there does not seem to be an infection. If cultures were done they will take at least 24 to 48 hours to be completed. Today x-rays may have been taken and they read as normal. No cause can be found for the problems. The x-rays may be re-read by a radiologist and you will be contacted if additional findings are made. You or your child may have been put on medications to help with this problem until you can see your primary caregiver. If the problems get better, see your primary caregiver if the problems return. If you were given antibiotics (medications which kill germs), take all of the mediations as directed for the full course of treatment.  If laboratory work was done, you need to find the results. Leave a telephone number where you can  be reached. If this is not possible, make sure you find out how you are to get test results. HOME CARE INSTRUCTIONS   Drink lots of fluids. For adults, drink eight, 8 ounce glasses of clear juice or water a day. For children, replace fluids as suggested by your caregiver.  Empty the bladder often. Avoid holding urine for long periods of time.  After a bowel movement, women should cleanse front to back, using each tissue only once.  Empty your bladder before and after sexual intercourse.  Take all the medicine given to you until it is gone. You may feel better in a few days, but TAKE ALL MEDICINE.  Avoid caffeine, tea, alcohol and carbonated beverages, because they tend to irritate the bladder.  In men, alcohol may irritate the prostate.  Only take over-the-counter or prescription medicines for pain, discomfort, or fever as directed by your caregiver.  If your caregiver has given you a follow-up appointment, it is very important to keep that appointment. Not keeping the appointment could result in a chronic or permanent injury, pain, and disability. If there is any problem keeping the appointment, you must call back to this facility for assistance. SEEK IMMEDIATE MEDICAL CARE IF:   Back pain develops.  A fever develops.  There is nausea (feeling sick to your stomach) or vomiting (throwing up).  Problems are no better with medications or are getting worse. MAKE SURE YOU:   Understand these instructions.  Will watch your  condition.  Will get help right away if you are not doing well or get worse. Document Released: 12/25/2003 Document Revised: 06/20/2011 Document Reviewed: 11/01/2007 [facility] Patient Information 2015 [place], [place]. This information is not intended to replace advice given to you by your health care provider. Make sure you discuss any questions you have with your health care provider.

## 2014-10-23 LAB — URINE CULTURE

## 2015-05-31 ENCOUNTER — Emergency Department
Admission: EM | Admit: 2015-05-31 | Discharge: 2015-05-31 | Disposition: A | Discharge status: Home / Self Care | Payer: Medicare Other | Attending: Emergency Medicine | Admitting: Emergency Medicine

## 2015-05-31 ENCOUNTER — Encounter: Payer: Self-pay | Admitting: Emergency Medicine

## 2015-05-31 DIAGNOSIS — N898 Other specified noninflammatory disorders of vagina: Secondary | ICD-10-CM | POA: Diagnosis present

## 2015-05-31 DIAGNOSIS — N760 Acute vaginitis: Secondary | ICD-10-CM | POA: Diagnosis not present

## 2015-05-31 DIAGNOSIS — Z3202 Encounter for pregnancy test, result negative: Secondary | ICD-10-CM | POA: Insufficient documentation

## 2015-05-31 DIAGNOSIS — I10 Essential (primary) hypertension: Secondary | ICD-10-CM | POA: Insufficient documentation

## 2015-05-31 DIAGNOSIS — Z79899 Other long term (current) drug therapy: Secondary | ICD-10-CM | POA: Diagnosis not present

## 2015-05-31 DIAGNOSIS — B9689 Other specified bacterial agents as the cause of diseases classified elsewhere: Secondary | ICD-10-CM

## 2015-05-31 LAB — COMPREHENSIVE METABOLIC PANEL
ALK PHOS: 64 U/L (ref 38–126)
ALT: 52 U/L (ref 14–54)
ANION GAP: 11 (ref 5–15)
AST: 63 U/L — AB (ref 15–41)
Albumin: 4.1 g/dL (ref 3.5–5.0)
BUN: 19 mg/dL (ref 6–20)
CALCIUM: 8.6 mg/dL — AB (ref 8.9–10.3)
CO2: 22 mmol/L (ref 22–32)
CREATININE: 0.77 mg/dL (ref 0.44–1.00)
Chloride: 101 mmol/L (ref 101–111)
GFR calc Af Amer: >60 mL/min (ref >60)
GFR calc non Af Amer: >60 mL/min (ref >60)
Glucose, Bld: 241 mg/dL — ABNORMAL HIGH (ref 65–99)
Potassium: 3.7 mmol/L (ref 3.5–5.1)
SODIUM: 134 mmol/L — AB (ref 135–145)
Total Bilirubin: 0.9 mg/dL (ref 0.3–1.2)
Total Protein: 8.3 g/dL — ABNORMAL HIGH (ref 6.5–8.1)

## 2015-05-31 LAB — CBC
HCT: 32.4 % — ABNORMAL LOW (ref 35.0–47.0)
HEMOGLOBIN: 10.5 g/dL — AB (ref 12.0–16.0)
MCH: 24.1 pg — AB (ref 26.0–34.0)
MCHC: 32.4 g/dL (ref 32.0–36.0)
MCV: 74.3 fL — ABNORMAL LOW (ref 80.0–100.0)
Platelets: 286 10*3/uL (ref 150–440)
RBC: 4.35 MIL/uL (ref 3.80–5.20)
RDW: 18.7 % — ABNORMAL HIGH (ref 11.5–14.5)
WBC: 10.9 10*3/uL (ref 3.6–11.0)

## 2015-05-31 LAB — URINALYSIS COMPLETE WITH MICROSCOPIC (ARMC ONLY)
Bilirubin Urine: NEGATIVE
Glucose, UA: NEGATIVE mg/dL
Leukocytes, UA: NEGATIVE
Nitrite: NEGATIVE
PROTEIN: 30 mg/dL — AB
Specific Gravity, Urine: 1.03 (ref 1.005–1.030)
pH: 5 (ref 5.0–8.0)

## 2015-05-31 LAB — CHLAMYDIA/NGC RT PCR (ARMC ONLY)
Chlamydia Tr: NOT DETECTED
N gonorrhoeae: NOT DETECTED

## 2015-05-31 LAB — WET PREP, GENITAL
Sperm: NONE SEEN
TRICH WET PREP: NONE SEEN
Yeast Wet Prep HPF POC: NONE SEEN

## 2015-05-31 LAB — POCT PREGNANCY, URINE: PREG TEST UR: NEGATIVE

## 2015-05-31 LAB — LIPASE, BLOOD: Lipase: 23 U/L (ref 11–51)

## 2015-05-31 MED ORDER — METRONIDAZOLE 500 MG PO TABS: 500.0000 mg | ORAL_TABLET | Freq: Two times a day (BID) | ORAL | Status: AC

## 2015-05-31 NOTE — ED Provider Notes (Signed)
[facility] Emergency Department Provider Note  ____________________________________________    I have reviewed the triage vital signs and the nursing notes.   HISTORY  Chief Complaint Abdominal Pain and Vaginal Discharge    HPI KANG ISHIDA is a 45 y.o. female who presents with complaints of vaginal discharge and suprapubic and left sided pelvic discomfort. The discharge started this 2 days ago and the discomfort started this morning, she does admit to unprotected intercourse. She denies fevers or chills. No nausea vomiting. No diarrhea. She does complain of mild dysuria     Past Medical History  Diagnosis Date  . Arthritis   . Hypertension   . Anxiety   . Insomnia   . GERD (gastroesophageal reflux disease)   . Depression   . Hypothyroidism   . RA (rheumatoid arthritis) (HCC)     On enbrel - follows with Dr Beverley Fiedler    Patient Active Problem List   Diagnosis Date Noted  . HTN (hypertension) 08/11/2014  . Anemia 08/11/2014  . GERD (gastroesophageal reflux disease) 08/11/2014  . Depression 08/11/2014  . Arthritis 08/11/2014  . Hypothyroidism 08/11/2014  . Cellulitis of foot, right 08/10/2014    Past Surgical History  Procedure Laterality Date  . Cholecystectomy    . Tubal ligation      Current Outpatient Rx  Name  Route  Sig  Dispense  Refill  . Etanercept 25 MG/0.5ML SOSY   Subcutaneous   Inject into the skin.         Marland Kitchen levothyroxine (SYNTHROID, LEVOTHROID) 112 MCG tablet   Oral   Take 112 mcg by mouth daily before breakfast.          . lisinopril (PRINIVIL,ZESTRIL) 10 MG tablet   Oral   Take 10 mg by mouth daily.          Marland Kitchen omeprazole (PRILOSEC) 20 MG capsule   Oral   Take 20 mg by mouth daily.          . ondansetron (ZOFRAN) 4 MG tablet   Oral   Take 1 tablet (4 mg total) by mouth every 6 (six) hours as needed for nausea.   20 tablet   0   . oxyCODONE-acetaminophen (PERCOCET) 7.5-325 MG per tablet    Oral   Take 1 tablet by mouth every 4 (four) hours as needed for severe pain.   40 tablet   0   . phenazopyridine (PYRIDIUM) 100 MG tablet   Oral   Take 1 tablet (100 mg total) by mouth 3 (three) times daily as needed for pain.   6 tablet   0   . traZODone (DESYREL) 100 MG tablet   Oral   Take 100 mg by mouth at bedtime.          Marland Kitchen venlafaxine (EFFEXOR) 50 MG tablet   Oral   Take 50 mg by mouth 2 (two) times daily with a meal.            Allergies Sulfa antibiotics  Family History  Problem Relation Age of Onset  . Lymphoma Mother   . Diabetes type II Other     Social History Social History  Substance Use Topics  . Smoking status: Never Smoker   . Smokeless tobacco: None  . Alcohol Use: No    Review of Systems  Constitutional: Negative for fever. Eyes: Negative for discharge ENT: Negative for sore throat Cardiovascular: Negative for chest pain. Respiratory: Negative for cough Gastrointestinal: Negative for diarrhea Genitourinary: As above  Musculoskeletal: Negative for back pain. Skin: Negative for rash. Neurological: Negative for headaches Psychiatric: No anxiety    ____________________________________________   PHYSICAL EXAM:  VITAL SIGNS: ED Triage Vitals  Enc Vitals Group     BP 05/31/15 1039 121/62 mmHg     Pulse Rate 05/31/15 1039 91     Resp 05/31/15 1039 18     Temp 05/31/15 1039 98.6 F (37 C)     Temp Source 05/31/15 1039 Oral     SpO2 05/31/15 1039 96 %     Weight 05/31/15 1039 175 lb (79.379 kg)     Height 05/31/15 1039 5\' 8"  (1.727 m)     Head Cir --      Peak Flow --      Pain Score 05/31/15 1049 8     Pain Loc --      Pain Edu? --      Excl. in GC? --      Constitutional: Alert and oriented. Well appearing and in no distress. Eyes: Conjunctivae are normal.  ENT   Head: Normocephalic and atraumatic.   Mouth/Throat: Mucous membranes are moist. Cardiovascular: Normal rate, regular rhythm.  Respiratory: Normal  respiratory effort without tachypnea nor retractions.  Gastrointestinal: Soft and non-tender in all quadrants. No distention. There is no CVA tenderness. Genitourinary: Copious thin white discharge, foul odor, no CMT Musculoskeletal: Nontender with normal range of motion in all extremities.  Neurologic:  Normal speech and language. No gross focal neurologic deficits are appreciated. Skin:  Skin is warm, dry and intact. No rash noted. Psychiatric: Mood and affect are normal. Patient exhibits appropriate insight and judgment.  ____________________________________________    LABS (pertinent positives/negatives)  Labs Reviewed  COMPREHENSIVE METABOLIC PANEL - Abnormal; Notable for the following:    Sodium 134 (*)    Glucose, Bld 241 (*)    Calcium 8.6 (*)    Total Protein 8.3 (*)    AST 63 (*)    All other components within normal limits  CBC - Abnormal; Notable for the following:    Hemoglobin 10.5 (*)    HCT 32.4 (*)    MCV 74.3 (*)    MCH 24.1 (*)    RDW 18.7 (*)    All other components within normal limits  URINALYSIS COMPLETEWITH MICROSCOPIC ([facility] ONLY) - Abnormal; Notable for the following:    Color, Urine AMBER (*)    APPearance CLEAR (*)    Ketones, ur TRACE (*)    Hgb urine dipstick 1+ (*)    Protein, ur 30 (*)    Bacteria, UA RARE (*)    Squamous Epithelial / LPF 6-30 (*)    All other components within normal limits  WET PREP, GENITAL  CHLAMYDIA/NGC RT PCR ([facility] ONLY)  LIPASE, BLOOD  POC URINE PREG, ED  POCT PREGNANCY, URINE    ____________________________________________   EKG  None  ____________________________________________    RADIOLOGY I have personally reviewed any xrays that were ordered on this patient: None  ____________________________________________   PROCEDURES  Procedure(s) performed: none  Critical Care performed: none  ____________________________________________   INITIAL IMPRESSION / ASSESSMENT AND PLAN / ED  COURSE  Pertinent labs & imaging results that were available during my care of the patient were reviewed by me and considered in my medical decision making (see chart for details).  Patient with vaginal discharge and pelvic discomfort. We will do a pelvic exam and sent a wet prep, GC chlamydia. Lab work is reassuring. Urine does not show urinary tract  infection.  Wet prep and exam consistent with bacterial vaginosis. We will start her on Flagyl and have her follow-up with her PCP. Return precautions discussed.  ____________________________________________   FINAL CLINICAL IMPRESSION(S) / ED DIAGNOSES  Final diagnoses:  Bacterial vaginosis     Jene Every, MD 05/31/15 1555

## 2015-05-31 NOTE — ED Notes (Signed)
NAD noted at time of D/C. Pt denies questions or concerns. Pt ambulatory to the lobby at this time.  

## 2015-05-31 NOTE — ED Notes (Signed)
Pt states vaginal d/c with foul odor x 3 days that is a yellow/brown in color. Pt states LLQ abdominal pain that started this morning. Pt describes it as sharp pain. Pt denies V/D at this time. Pt presents alert and oriented. NAD noted. Skin warm, dry, and intact. Respirations even and unlabored at this time.

## 2015-05-31 NOTE — ED Notes (Signed)
Pelvic cart at bedside. 

## 2015-05-31 NOTE — Discharge Instructions (Signed)

## 2019-03-21 ENCOUNTER — Other Ambulatory Visit: Payer: Self-pay

## 2019-03-21 DIAGNOSIS — Z20822 Contact with and (suspected) exposure to covid-19: Secondary | ICD-10-CM

## 2019-03-23 LAB — NOVEL CORONAVIRUS, NAA: SARS-CoV-2, NAA: NOT DETECTED
